# Patient Record
Sex: Male | Born: 1990 | Race: White | Hispanic: No | Marital: Single | Smoking: Current every day smoker
Health system: Southern US, Community
[De-identification: ages and names within clinical notes are randomized; demographics above are authoritative.]

## PROBLEM LIST (undated history)

## (undated) DIAGNOSIS — F99 Mental disorder, not otherwise specified: Secondary | ICD-10-CM

## (undated) DIAGNOSIS — F329 Major depressive disorder, single episode, unspecified: Secondary | ICD-10-CM

## (undated) DIAGNOSIS — F32A Depression, unspecified: Secondary | ICD-10-CM

---

## 2013-05-20 ENCOUNTER — Encounter (HOSPITAL_COMMUNITY): Payer: Self-pay | Admitting: *Deleted

## 2013-05-20 ENCOUNTER — Inpatient Hospital Stay (HOSPITAL_COMMUNITY)
Admission: RE | Admit: 2013-05-20 | Discharge: 2013-05-25 | DRG: 897 | Disposition: A | Discharge status: Home / Self Care | Payer: 59 | Attending: Psychiatry | Admitting: Psychiatry

## 2013-05-20 ENCOUNTER — Emergency Department (HOSPITAL_COMMUNITY)
Admission: EM | Admit: 2013-05-20 | Discharge: 2013-05-20 | Disposition: A | Discharge status: Other Acute Inpatient Hospital | Payer: Self-pay | Attending: Emergency Medicine | Admitting: Emergency Medicine

## 2013-05-20 ENCOUNTER — Encounter (HOSPITAL_COMMUNITY): Payer: Self-pay | Admitting: Emergency Medicine

## 2013-05-20 DIAGNOSIS — F1020 Alcohol dependence, uncomplicated: Principal | ICD-10-CM

## 2013-05-20 DIAGNOSIS — Z0289 Encounter for other administrative examinations: Secondary | ICD-10-CM | POA: Insufficient documentation

## 2013-05-20 DIAGNOSIS — F1220 Cannabis dependence, uncomplicated: Secondary | ICD-10-CM

## 2013-05-20 DIAGNOSIS — F17200 Nicotine dependence, unspecified, uncomplicated: Secondary | ICD-10-CM | POA: Insufficient documentation

## 2013-05-20 DIAGNOSIS — F489 Nonpsychotic mental disorder, unspecified: Secondary | ICD-10-CM | POA: Insufficient documentation

## 2013-05-20 DIAGNOSIS — F3289 Other specified depressive episodes: Secondary | ICD-10-CM | POA: Insufficient documentation

## 2013-05-20 DIAGNOSIS — F1410 Cocaine abuse, uncomplicated: Secondary | ICD-10-CM | POA: Diagnosis present

## 2013-05-20 DIAGNOSIS — Z5900 Homelessness unspecified: Secondary | ICD-10-CM

## 2013-05-20 DIAGNOSIS — R059 Cough, unspecified: Secondary | ICD-10-CM | POA: Insufficient documentation

## 2013-05-20 DIAGNOSIS — J3489 Other specified disorders of nose and nasal sinuses: Secondary | ICD-10-CM | POA: Insufficient documentation

## 2013-05-20 DIAGNOSIS — F321 Major depressive disorder, single episode, moderate: Secondary | ICD-10-CM | POA: Diagnosis present

## 2013-05-20 DIAGNOSIS — F1920 Other psychoactive substance dependence, uncomplicated: Secondary | ICD-10-CM

## 2013-05-20 DIAGNOSIS — R0981 Nasal congestion: Secondary | ICD-10-CM

## 2013-05-20 DIAGNOSIS — L539 Erythematous condition, unspecified: Secondary | ICD-10-CM | POA: Insufficient documentation

## 2013-05-20 DIAGNOSIS — F329 Major depressive disorder, single episode, unspecified: Secondary | ICD-10-CM | POA: Insufficient documentation

## 2013-05-20 DIAGNOSIS — F1010 Alcohol abuse, uncomplicated: Secondary | ICD-10-CM | POA: Diagnosis present

## 2013-05-20 DIAGNOSIS — R05 Cough: Secondary | ICD-10-CM | POA: Insufficient documentation

## 2013-05-20 HISTORY — DX: Major depressive disorder, single episode, unspecified: F32.9

## 2013-05-20 HISTORY — DX: Depression, unspecified: F32.A

## 2013-05-20 HISTORY — DX: Mental disorder, not otherwise specified: F99

## 2013-05-20 LAB — COMPREHENSIVE METABOLIC PANEL
ALT: 21 U/L (ref 0–53)
Alkaline Phosphatase: 52 U/L (ref 39–117)
BUN: 7 mg/dL (ref 6–23)
CO2: 27 mEq/L (ref 19–32)
GFR calc Af Amer: >90 mL/min (ref >90)
GFR calc non Af Amer: >90 mL/min (ref >90)
Glucose, Bld: 84 mg/dL (ref 70–99)
Potassium: 3.6 mEq/L (ref 3.5–5.1)
Sodium: 135 mEq/L (ref 135–145)
Total Bilirubin: 0.2 mg/dL — ABNORMAL LOW (ref 0.3–1.2)
Total Protein: 7.9 g/dL (ref 6.0–8.3)

## 2013-05-20 LAB — CBC
HCT: 44.8 % (ref 39.0–52.0)
Hemoglobin: 15.6 g/dL (ref 13.0–17.0)
MCH: 32.6 pg (ref 26.0–34.0)
RBC: 4.78 MIL/uL (ref 4.22–5.81)

## 2013-05-20 LAB — RAPID URINE DRUG SCREEN, HOSP PERFORMED: Barbiturates: NOT DETECTED

## 2013-05-20 MED ORDER — CHLORDIAZEPOXIDE HCL 25 MG PO CAPS
25.0000 mg | ORAL_CAPSULE | Freq: Four times a day (QID) | ORAL | Status: AC | PRN
  Filled 2013-05-20: qty 1

## 2013-05-20 MED ORDER — CETIRIZINE-PSEUDOEPHEDRINE ER 5-120 MG PO TB12: 1.0000 | ORAL_TABLET | Freq: Two times a day (BID) | ORAL | Status: DC

## 2013-05-20 MED ORDER — CHLORDIAZEPOXIDE HCL 25 MG PO CAPS
25.0000 mg | ORAL_CAPSULE | Freq: Three times a day (TID) | ORAL | Status: AC
  Administered 2013-05-22 – 2013-05-23 (×3): 25 mg via ORAL
  Filled 2013-05-20 (×3): qty 1

## 2013-05-20 MED ORDER — CHLORDIAZEPOXIDE HCL 25 MG PO CAPS
25.0000 mg | ORAL_CAPSULE | ORAL | Status: AC
  Administered 2013-05-23 – 2013-05-24 (×2): 25 mg via ORAL
  Filled 2013-05-20 (×2): qty 1

## 2013-05-20 MED ORDER — HYDROXYZINE HCL 25 MG PO TABS: 25.0000 mg | ORAL_TABLET | Freq: Four times a day (QID) | ORAL | Status: AC | PRN

## 2013-05-20 MED ORDER — MAGNESIUM HYDROXIDE 400 MG/5ML PO SUSP: 30.0000 mL | Freq: Every day | ORAL | Status: DC | PRN

## 2013-05-20 MED ORDER — THIAMINE HCL 100 MG/ML IJ SOLN: 100.0000 mg | Freq: Once | INTRAMUSCULAR | Status: DC

## 2013-05-20 MED ORDER — ACETAMINOPHEN 325 MG PO TABS: 650.0000 mg | ORAL_TABLET | Freq: Four times a day (QID) | ORAL | Status: DC | PRN

## 2013-05-20 MED ORDER — ONDANSETRON 4 MG PO TBDP: 4.0000 mg | ORAL_TABLET | Freq: Four times a day (QID) | ORAL | Status: AC | PRN

## 2013-05-20 MED ORDER — TRAZODONE HCL 50 MG PO TABS
50.0000 mg | ORAL_TABLET | Freq: Every evening | ORAL | Status: DC | PRN
  Administered 2013-05-22: 50 mg via ORAL
  Filled 2013-05-20: qty 1

## 2013-05-20 MED ORDER — VITAMIN B-1 100 MG PO TABS
100.0000 mg | ORAL_TABLET | Freq: Every day | ORAL | Status: DC
  Administered 2013-05-21 – 2013-05-25 (×5): 100 mg via ORAL
  Filled 2013-05-20 (×7): qty 1

## 2013-05-20 MED ORDER — ADULT MULTIVITAMIN W/MINERALS CH
1.0000 | ORAL_TABLET | Freq: Every day | ORAL | Status: DC
  Administered 2013-05-21 – 2013-05-25 (×5): 1 via ORAL
  Filled 2013-05-20 (×7): qty 1

## 2013-05-20 MED ORDER — LOPERAMIDE HCL 2 MG PO CAPS: 2.0000 mg | ORAL_CAPSULE | ORAL | Status: AC | PRN

## 2013-05-20 MED ORDER — CHLORDIAZEPOXIDE HCL 25 MG PO CAPS
25.0000 mg | ORAL_CAPSULE | Freq: Every day | ORAL | Status: AC
  Administered 2013-05-25: 25 mg via ORAL
  Filled 2013-05-20 (×2): qty 1

## 2013-05-20 MED ORDER — GUAIFENESIN ER 600 MG PO TB12: 1200.0000 mg | ORAL_TABLET | Freq: Two times a day (BID) | ORAL | Status: DC

## 2013-05-20 MED ORDER — CHLORDIAZEPOXIDE HCL 25 MG PO CAPS
25.0000 mg | ORAL_CAPSULE | Freq: Four times a day (QID) | ORAL | Status: AC
  Administered 2013-05-20 – 2013-05-22 (×6): 25 mg via ORAL
  Filled 2013-05-20 (×5): qty 1

## 2013-05-20 MED ORDER — ALUM & MAG HYDROXIDE-SIMETH 200-200-20 MG/5ML PO SUSP: 30.0000 mL | ORAL | Status: DC | PRN

## 2013-05-20 NOTE — ED Provider Notes (Signed)
CSN: [id number]     Arrival date & time 05/20/13  1848 History   First MD Initiated Contact with Patient 05/20/13 2020     Chief Complaint  Patient presents with  . Medical Clearance   HPI  History provided by the patient. Patient is a 22 year old male presenting with request for medical clearance. The patient was seen at [facility] for cocaine dependency. He has been accepted for treatment and was sent for medical clearance. Patient does mention having some recent nasal congestion. He does snort cocaine reports using heavy amounts in the last several days. He has occasional brief nosebleeds. He denies any headache, fever, chills or sweats. Patient has no further complaints. He has not used any treatments for his symptoms. Denies any aggravating or alleviating factors. Denies any associated depression, SI or HI.    No past medical history on file. No past surgical history on file. No family history on file. History  Substance Use Topics  . Smoking status: Current Every Day Smoker -- 2.00 packs/day    Types: Cigarettes  . Smokeless tobacco: Not on file  . Alcohol Use: 0.0 oz/week    18-24 Cans of beer per week    Review of Systems  Constitutional: Negative for fever, chills and diaphoresis.  HENT: Positive for congestion, rhinorrhea and sinus pressure.   Respiratory: Positive for cough.   Gastrointestinal: Negative for nausea, vomiting and constipation.  Psychiatric/Behavioral: Negative for suicidal ideas.  All other systems reviewed and are negative.    Allergies  Review of patient's allergies indicates no known allergies.  Home Medications  No current outpatient prescriptions on file. BP 133/74  Pulse 89  Temp(Src) 98.6 F (37 C) (Oral)  Resp 16  SpO2 100% Physical Exam  Nursing note and vitals reviewed. Constitutional: He is oriented to person, place, and time. He appears well-developed and well-nourished. No distress.  HENT:  Head: Normocephalic and atraumatic.  Right  Ear: Tympanic membrane normal.  Left Ear: Tympanic membrane normal.  Mild erythema in a rotation to the nasal septum bilaterally. No perforation. No bleeding. There is some thick mucus to the superior aspect of the nostrils greatest on the right. No significant tenderness over the maxillary sinuses.  Cardiovascular: Normal rate and regular rhythm.   Pulmonary/Chest: Effort normal and breath sounds normal. No respiratory distress.  Abdominal: Soft.  Neurological: He is alert and oriented to person, place, and time.  Skin: Skin is warm.  Psychiatric: He has a normal mood and affect. His behavior is normal.    ED Course  Procedures     COORDINATION OF CARE:  Nursing notes reviewed. Vital signs reviewed. Initial pt interview and examination performed.   8:10PM -the patient seen and evaluated. Patient well appearing. Some complaints of nasal congestion. Otherwise examined history and concerning. Patient clinically medically cleared for continued cocaine dependence treatment. Discussed work up plan with pt at bedside, which includes laboratory tests for further medical clearance. Pt agrees with plan.  Medical labs unremarkable. Patient medically cleared for continued treatment.  Results for orders placed during the hospital encounter of 05/20/13  CBC      Result Value Range   WBC 6.0  4.0 - 10.5 K/uL   RBC 4.78  4.22 - 5.81 MIL/uL   Hemoglobin 15.6  13.0 - 17.0 g/dL   HCT 40.9  81.1 - 91.4 %   MCV 93.7  78.0 - 100.0 fL   MCH 32.6  26.0 - 34.0 pg   MCHC 34.8  30.0 -  36.0 g/dL   RDW 16.1  09.6 - 04.5 %   Platelets 150  150 - 400 K/uL  COMPREHENSIVE METABOLIC PANEL      Result Value Range   Sodium 135  135 - 145 mEq/L   Potassium 3.6  3.5 - 5.1 mEq/L   Chloride 97  96 - 112 mEq/L   CO2 27  19 - 32 mEq/L   Glucose, Bld 84  70 - 99 mg/dL   BUN 7  6 - 23 mg/dL   Creatinine, Ser 4.09  0.50 - 1.35 mg/dL   Calcium 9.3  8.4 - 81.1 mg/dL   Total Protein 7.9  6.0 - 8.3 g/dL   Albumin  4.4  3.5 - 5.2 g/dL   AST 21  0 - 37 U/L   ALT 21  0 - 53 U/L   Alkaline Phosphatase 52  39 - 117 U/L   Total Bilirubin 0.2 (*) 0.3 - 1.2 mg/dL   GFR calc non Af Amer >90  >90 mL/min   GFR calc Af Amer >90  >90 mL/min  ETHANOL      Result Value Range   Alcohol, Ethyl (B) <11  0 - 11 mg/dL  URINE RAPID DRUG SCREEN (HOSP PERFORMED)      Result Value Range   Opiates NONE DETECTED  NONE DETECTED   Cocaine POSITIVE (*) NONE DETECTED   Benzodiazepines NONE DETECTED  NONE DETECTED   Amphetamines NONE DETECTED  NONE DETECTED   Tetrahydrocannabinol POSITIVE (*) NONE DETECTED   Barbiturates NONE DETECTED  NONE DETECTED      MDM   1. Cocaine abuse   2. Sinus congestion        Angus Seller, PA-C 05/20/13 2049

## 2013-05-20 NOTE — ED Notes (Addendum)
Pt states that he has a bed at [facility] and is coming here for med clearance.  Requesting detox from cocaine, etoh and marijuana.  States he has used all of them in the past 12 hrs.  States he drinks an 18 pack of beer a day.  Denies SI/HI.
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
## 2013-05-20 NOTE — Tx Team (Signed)
Initial Interdisciplinary Treatment Plan  PATIENT STRENGTHS: (choose at least two) Ability for insight Average or above average intelligence Capable of independent living General fund of knowledge  PATIENT STRESSORS: Substance abuse   PROBLEM LIST: Problem List/Patient Goals Date to be addressed Date deferred Reason deferred Estimated date of resolution  Depression 05/20/13     Polysubstance abuse 05/20/13                                                DISCHARGE CRITERIA:  Ability to meet basic life and health needs Improved stabilization in mood, thinking, and/or behavior Withdrawal symptoms are absent or subacute and managed without 24-hour nursing intervention  PRELIMINARY DISCHARGE PLAN: Attend aftercare/continuing care group Placement in alternative living arrangements  PATIENT/FAMIILY INVOLVEMENT: This treatment plan has been presented to and reviewed with the patient, Saron Tweed, and/or family member, .  The patient and family have been given the opportunity to ask questions and make suggestions.  Antoinett Dorman, [place] 05/20/2013, 10:55 PM
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
## 2013-05-20 NOTE — ED Notes (Signed)
Report called to [facility]

## 2013-05-20 NOTE — ED Notes (Signed)
[facility] called for transport. 

## 2013-05-21 DIAGNOSIS — F1220 Cannabis dependence, uncomplicated: Secondary | ICD-10-CM

## 2013-05-21 DIAGNOSIS — F1020 Alcohol dependence, uncomplicated: Secondary | ICD-10-CM

## 2013-05-21 MED ORDER — MIRTAZAPINE 15 MG PO TABS
15.0000 mg | ORAL_TABLET | Freq: Every day | ORAL | Status: DC
  Administered 2013-05-21 – 2013-05-24 (×4): 15 mg via ORAL
  Filled 2013-05-21 (×3): qty 1
  Filled 2013-05-21: qty 14
  Filled 2013-05-21 (×3): qty 1

## 2013-05-21 MED ORDER — NICOTINE POLACRILEX 2 MG MT GUM
2.0000 mg | CHEWING_GUM | OROMUCOSAL | Status: DC | PRN
  Administered 2013-05-21 – 2013-05-25 (×11): 2 mg via ORAL
  Filled 2013-05-21 (×7): qty 1

## 2013-05-21 MED ORDER — ENSURE COMPLETE PO LIQD
237.0000 mL | Freq: Two times a day (BID) | ORAL | Status: DC
  Administered 2013-05-21 – 2013-05-25 (×8): 237 mL via ORAL

## 2013-05-21 NOTE — Progress Notes (Signed)
D Pt. Denies SI and HI.  No complaints of pain or discomfort noted.  A Writer offers support and encouragement.    R Pt. Remains safe on the unit.  Pt is interacting with his peers in the dayroom, and has been calm and cooperative this pm

## 2013-05-21 NOTE — Progress Notes (Signed)
[facility] Group Notes:  (Nursing/MHT/Case Management/Adjunct)  Date:  05/21/2013  Time:  3:40 PM  Type of Therapy:  Psychoeducational Skills  Participation Level:  Did Not Attend   Summary of Progress/Problems: pt was in bed asleep.  Caswell Corwin 05/21/2013, 3:40 PM

## 2013-05-21 NOTE — Progress Notes (Signed)
NUTRITION ASSESSMENT  Pt identified as at risk on the Malnutrition Screen Tool  INTERVENTION: 1. Educated patient on the importance of nutrition and encouraged intake of food and beverages. 2. Discussed weight goals. 3. Supplements: MVI and thiamine.  Will order Ensure bid  NUTRITION DIAGNOSIS: Unintentional weight loss related to sub-optimal intake as evidenced by pt report.   Goal: Pt to meet >/= 90% of their estimated nutrition needs.  Monitor:  PO intake  Assessment:  Patient admitted with cocaine abuse and crystal meth.  Patient reports only fair intake currently and prior to admit.  UBW of 160-175 lbs but is unable to state when he last weighed this.  Patient was surprised at current weight.  Patient is underweight.  Patient is 82% of his UBW.    22 y.o. male  Height: Ht Readings from Last 1 Encounters:  05/20/13 6\' 2"  (1.88 m)    Weight: Wt Readings from Last 1 Encounters:  05/20/13 131 lb (59.421 kg)    Weight Hx: Wt Readings from Last 10 Encounters:  05/20/13 131 lb (59.421 kg)    BMI:  Body mass index is 16.81 kg/(m^2). Pt meets criteria for underweight based on current BMI.  Estimated Nutritional Needs: Kcal: 25-30 kcal/kg Protein: > 1 gram protein/kg Fluid: 1 ml/kcal  Diet Order: General Pt is also offered choice of unit snacks mid-morning and mid-afternoon.  Pt is eating as desired.   Lab results and medications reviewed.   Oran Rein, RD, LDN Clinical Inpatient Dietitian Pager:  [phone] Weekend and after hours pager:  [phone]

## 2013-05-21 NOTE — [facility] Group Notes (Signed)
[facility] LCSW Group Therapy Note   05/21/2013/10:00 AM  Type of Therapy and Topic: Group Therapy: Feelings Around Returning Home & Establishing a Supportive Framework and Activity to Identify signs of Improvement or Decompensation   Participation Level: Did not attend; declined when encouraged to join group   Carney Bern, LCSW

## 2013-05-21 NOTE — Progress Notes (Signed)
05-21-13  NSG NOTE  7a-7p  D: Affect is depressed and anxious.  Mood is depressed, irritable and anxious.  Behavior is cooperative with encouragement, direction and support.  Interacts appropriately with peers and staff.  Participated in goals group, counselor lead group, and recreation.  Goal for today is to identify what changes he needs to make in his life.   A:  Medications per MD order.  Support given throughout day.  1:1 time spent with pt.  R:  Following treatment plan.  Denies HI/SI, auditory or visual hallucinations.  Contracts for safety.

## 2013-05-21 NOTE — [facility] Group Notes (Signed)
[facility] Group Notes:  (Nursing/MHT/Case Management/Adjunct)  Date:  05/21/2013  Time:  3:07 PM  Type of Therapy:  Psychoeducational Skills  Participation Level:  Minimal  Participation Quality:  Attentive  Affect:  Anxious and Depressed  Cognitive:  Oriented  Insight:  Limited  Engagement in Group:  Limited  Modes of Intervention:  Education and Support  Summary of Progress/Problems:   Pt watched educational video on how attitude effects behavior.  Video was discussed as group and what was learned.   Inda Merlin 05/21/2013, 3:07 PM

## 2013-05-21 NOTE — H&P (Signed)
Psychiatric Admission Assessment Adult  Patient Identification:  Kristopher Hess  Date of Evaluation:  05/21/2013  Chief Complaint:  alcohol detox  History of Present Illness: This is a 22 year old Caucasian male his first psychiatric admission to this hospital. Admitted as walk-in, brought in by boss with complaints of alcohol/drug intoxications requesting treatment. Patient reports, "My boss brought me to this hospital yesterday. I had told him that I needed help for my substance abuse problems. I also want to make sure that I will have a job after my rehabilitation treatment. 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 I'm pretty much homeless since August of this year. I live with my friends. It is my own fault that I'm homeless. I make 1500 dollars a month, I spend it all drugs and parties.I have been using drugs x 5 years, and THC x 10. Drugs make me feel complete, I  thought. I'm an addict. I'm not happy when sober. My goal is to get clean. Get into a meaningful relationship with someone and stay sober. I'm very depressed, have been for a long time. I don't sleep at night and I don't have any appetite".  Elements:  Location:  [facility] adult unit. Quality:  Black-out, increased drug, alcohol use. Severity:  Severe. Timing:  Constant. Duration:  Chronic. Context:  Homelessness.  Associated Signs/Synptoms:  Depression Symptoms:  depressed mood, feelings of worthlessness/guilt, hopelessness, anxiety, insomnia, loss of energy/fatigue,  (Hypo) Manic Symptoms:  Impulsivity,  Anxiety Symptoms:  Excessive Worry,  Psychotic Symptoms:  Hallucinations: None  PTSD Symptoms: Had a traumatic exposure:  Denies  Psychiatric Specialty Exam: Physical Exam  Constitutional: He is oriented to person, place, and time. He appears well-developed.  HENT:  Head: Normocephalic.  Eyes: Pupils are equal, round, and reactive to light.  Neck: Normal range of motion.  Cardiovascular:  Normal rate.   Respiratory: Effort normal.  GI: Soft.  Genitourinary:  Did not assess.  Musculoskeletal: Normal range of motion.  Neurological: He is alert and oriented to person, place, and time.  Skin: Skin is warm and dry.  Psychiatric: His speech is normal and behavior is normal. Thought content normal. His mood appears anxious (Rated #8). Cognition and memory are normal. He expresses impulsivity. He exhibits a depressed mood (Rated #8).    Review of Systems  Constitutional: Positive for chills, malaise/fatigue and diaphoresis.  HENT: Negative.   Eyes: Negative.   Respiratory: Negative.   Cardiovascular: Negative.   Gastrointestinal: Positive for nausea.  Genitourinary: Negative.   Musculoskeletal: Positive for myalgias.  Skin: Negative.   Neurological: Positive for tremors.  Endo/Heme/Allergies: Negative.   Psychiatric/Behavioral: Positive for depression (Rated #8) and substance abuse (Alcohol dependence). Negative for suicidal ideas, hallucinations and memory loss. The patient is nervous/anxious (Rated #8) and has insomnia.     Blood pressure 122/72, pulse 74, temperature 97.9 F (36.6 C), temperature source Oral, resp. rate 16, height 6\' 2"  (1.88 m), weight 59.421 kg (131 lb).Body mass index is 16.81 kg/(m^2).  General Appearance: Fairly Groomed  Patent attorney::  Fair  Speech:  Clear and Coherent  Volume:  Normal  Mood:  Anxious and Depressed  Affect:  Flat  Thought Process:  Coherent and Goal Directed  Orientation:  Full (Time, Place, and Person)  Thought Content:  Rumination  Suicidal Thoughts:  No  Homicidal Thoughts:  No  Memory:  Immediate;   Good Recent;   Good Remote;   Good  Judgement:  Impaired  Insight:  Fair  Psychomotor Activity:  Normal  Concentration:  Fair  Recall:  Good  Akathisia:  No  Handed:  Right  AIMS (if indicated):     Assets:  Communication Skills Desire for Improvement  Sleep:  Number of Hours: 4.5    Past Psychiatric  History: Diagnosis: Alcohol dependence, Cannabis dependence  Hospitalizations: [facility]  Outpatient Care: None  Substance Abuse Care: None, but would like referral  Self-Mutilation: Denies  Suicidal Attempts: Denies  Violent Behaviors: Denies   Past Medical History:   Past Medical History  Diagnosis Date  . Mental disorder   . Depression    None.  Allergies:  No Known Allergies  PTA Medications: Prescriptions prior to admission  Medication Sig Dispense Refill  . cetirizine-pseudoephedrine (ZYRTEC-D) 5-120 MG per tablet Take 1 tablet by mouth 2 (two) times daily.  30 tablet  0  . guaiFENesin (MUCINEX) 600 MG 12 hr tablet Take 2 tablets (1,200 mg total) by mouth 2 (two) times daily.  40 tablet  0    Previous Psychotropic Medications:  Medication/Dose  See medication lists               Substance Abuse History in the last 12 months:  yes  Consequences of Substance Abuse: Medical Consequences:  Liver damage, Possible death by overdose Legal Consequences:  Arrests, jail time, Loss of driving privilege. Family Consequences:  Family discord, divorce and or separation.  Social History:  reports that he has been smoking Cigarettes.  He has been smoking about 2.00 packs per day. He does not have any smokeless tobacco history on file. He reports that he drinks alcohol. He reports that he uses illicit drugs (Cocaine and Marijuana). Additional Social History: Pain Medications: none Prescriptions: none Over the Counter: none History of alcohol / drug use?: Yes Longest period of sobriety (when/how long): none Negative Consequences of Use: Financial;Legal;Personal relationships;Work / Programmer, multimedia Withdrawal Symptoms: Tremors;Other (Comment) (anxiety) Name of Substance 1: ETOH 1 - Age of First Use: 10 1 - Amount (size/oz): 18-24 beers 1 - Frequency: daily 1 - Duration: ongoing x 3 years 1 - Last Use / Amount: 05/20/13 - 3 beers this AM Name of Substance 2: Cocaine 2 - Age of First  Use: 16 2 - Amount (size/oz): 1 gram 2 - Frequency: daily 2 - Duration: ongoing for one year 2 - Last Use / Amount: 05/19/13 - 1 gram Name of Substance 3: Marijuana 3 - Age of First Use: [age] 3 - Amount (size/oz): 1 blunt 3 - Frequency: daily 3 - Duration: ongoing for years 3 - Last Use / Amount: 05/19/13 - 1 blunt  Social history Current Place of Residence: [place], [place]  Place of Birth:  [place], [place]  Family Members: None reported  Marital Status:  Single  Children: 0  Sons:  Daughters:  Relationships: Single  Education:  GED  Educational Problems/Performance: Obtained GED  Religious Beliefs/Practices: NA  History of Abuse (Emotional/Phsycial/Sexual): None reported  Occupational Experiences: Employed  Hotel manager History:  None.  Legal History: None pending  Hobbies/Interests: None reported  Family History:  History reviewed. No pertinent family history.  Results for orders placed during the hospital encounter of 05/20/13 (from the past 72 hour(s))  URINE RAPID DRUG SCREEN (HOSP PERFORMED)     Status: Abnormal   Collection Time    05/20/13  6:48 PM      Result Value Range   Opiates NONE DETECTED  NONE DETECTED   Cocaine POSITIVE (*) NONE DETECTED   Benzodiazepines NONE DETECTED  NONE DETECTED   Amphetamines NONE DETECTED  NONE DETECTED   Tetrahydrocannabinol POSITIVE (*) NONE DETECTED   Barbiturates NONE DETECTED  NONE DETECTED   Comment:            DRUG SCREEN FOR MEDICAL PURPOSES     ONLY.  IF CONFIRMATION IS NEEDED     FOR ANY PURPOSE, NOTIFY LAB     WITHIN 5 DAYS.                LOWEST DETECTABLE LIMITS     FOR URINE DRUG SCREEN     Drug Class       Cutoff (ng/mL)     Amphetamine      1000     Barbiturate      200     Benzodiazepine   200     Tricyclics       300     Opiates          300     Cocaine          300     THC              50  CBC     Status: None   Collection Time    05/20/13  7:10 PM      Result Value Range   WBC 6.0  4.0 -  10.5 K/uL   RBC 4.78  4.22 - 5.81 MIL/uL   Hemoglobin 15.6  13.0 - 17.0 g/dL   HCT 16.1  09.6 - 04.5 %   MCV 93.7  78.0 - 100.0 fL   MCH 32.6  26.0 - 34.0 pg   MCHC 34.8  30.0 - 36.0 g/dL   RDW 40.9  81.1 - 91.4 %   Platelets 150  150 - 400 K/uL  COMPREHENSIVE METABOLIC PANEL     Status: Abnormal   Collection Time    05/20/13  7:10 PM      Result Value Range   Sodium 135  135 - 145 mEq/L   Potassium 3.6  3.5 - 5.1 mEq/L   Chloride 97  96 - 112 mEq/L   CO2 27  19 - 32 mEq/L   Glucose, Bld 84  70 - 99 mg/dL   BUN 7  6 - 23 mg/dL   Creatinine, Ser 7.82  0.50 - 1.35 mg/dL   Calcium 9.3  8.4 - 95.6 mg/dL   Total Protein 7.9  6.0 - 8.3 g/dL   Albumin 4.4  3.5 - 5.2 g/dL   AST 21  0 - 37 U/L   ALT 21  0 - 53 U/L   Alkaline Phosphatase 52  39 - 117 U/L   Total Bilirubin 0.2 (*) 0.3 - 1.2 mg/dL   GFR calc non Af Amer >90  >90 mL/min   GFR calc Af Amer >90  >90 mL/min   Comment: (NOTE)     The eGFR has been calculated using the CKD EPI equation.     This calculation has not been validated in all clinical situations.     eGFR's persistently <90 mL/min signify possible Chronic Kidney     Disease.  ETHANOL     Status: None   Collection Time    05/20/13  7:10 PM      Result Value Range   Alcohol, Ethyl (B) <11  0 - 11 mg/dL   Comment:            LOWEST DETECTABLE LIMIT FOR  SERUM ALCOHOL IS 11 mg/dL     FOR MEDICAL PURPOSES ONLY   Psychological Evaluations:  Assessment:   DSM5: Schizophrenia Disorders:  NA Obsessive-Compulsive Disorders:  NA Trauma-Stressor Disorders:  NA Substance/Addictive Disorders:  Alcohol Related Disorder - Severe (303.90) and Cannabis Use Disorder - Severe (304.30) Depressive Disorders:  Substance induced mood disorder.  AXIS I:  Alcohol Related Disorder - Severe (303.90) and Cannabis Use Disorder - Severe (304.30), Substance induced mood disorder AXIS II:  Deferred AXIS III:   Past Medical History  Diagnosis Date  . Mental disorder   .  Depression    AXIS IV:  other psychosocial or environmental problems and Polysubstance dependence AXIS V:  1-10 persistent dangerousness to self and others present  Treatment Plan/Recommendations: 1. Admit for crisis management and stabilization, estimated length of stay 3-5 days.  2. Medication management to reduce current symptoms to base line and improve the patient's overall level of functioning; (a). Initiate Remeron 15 mg Q bedtime for sleep, depression and appetite stimulation. 3. Treat health problems as indicated.  4. Develop treatment plan to decrease risk of relapse upon discharge and the need for readmission.  5. Psycho-social education regarding relapse prevention and self care.  6. Health care follow up as needed for medical problems.  7. Review, reconcile, and reinstate any pertinent home medications for other health issues where appropriate. 8. Call for consults with hospitalist for any additional specialty patient care services as needed.  Treatment Plan Summary: Daily contact with patient to assess and evaluate symptoms and progress in treatment Medication management  Current Medications:  Current Facility-Administered Medications  Medication Dose Route Frequency Provider Last Rate Last Dose  . acetaminophen (TYLENOL) tablet 650 mg  650 mg Oral Q6H PRN Nanine Means, NP      . alum & mag hydroxide-simeth (MAALOX/MYLANTA) 200-200-20 MG/5ML suspension 30 mL  30 mL Oral Q4H PRN Nanine Means, NP      . chlordiazePOXIDE (LIBRIUM) capsule 25 mg  25 mg Oral Q6H PRN Nanine Means, NP      . chlordiazePOXIDE (LIBRIUM) capsule 25 mg  25 mg Oral QID Nanine Means, NP   25 mg at 05/21/13 0752   Followed by  . [START ON 05/22/2013] chlordiazePOXIDE (LIBRIUM) capsule 25 mg  25 mg Oral TID Nanine Means, NP       Followed by  . [START ON 05/23/2013] chlordiazePOXIDE (LIBRIUM) capsule 25 mg  25 mg Oral BH-qamhs Nanine Means, NP       Followed by  . [START ON 05/25/2013] chlordiazePOXIDE  (LIBRIUM) capsule 25 mg  25 mg Oral Daily Nanine Means, NP      . hydrOXYzine (ATARAX/VISTARIL) tablet 25 mg  25 mg Oral Q6H PRN Nanine Means, NP      . loperamide (IMODIUM) capsule 2-4 mg  2-4 mg Oral PRN Nanine Means, NP      . magnesium hydroxide (MILK OF MAGNESIA) suspension 30 mL  30 mL Oral Daily PRN Nanine Means, NP      . multivitamin with minerals tablet 1 tablet  1 tablet Oral Daily Nanine Means, NP   1 tablet at 05/21/13 0751  . ondansetron (ZOFRAN-ODT) disintegrating tablet 4 mg  4 mg Oral Q6H PRN Nanine Means, NP      . thiamine (B-1) injection 100 mg  100 mg Intramuscular Once Nanine Means, NP      . thiamine (VITAMIN B-1) tablet 100 mg  100 mg Oral Daily Nanine Means, NP   100 mg at 05/21/13 0751  .  traZODone (DESYREL) tablet 50 mg  50 mg Oral QHS PRN Nanine Means, NP        Observation Level/Precautions:  15 minute checks  Laboratory:  Rviewed ED lab findings on file  Psychotherapy: Group sessions, AA/NA meetings  Medications:  See medication lists  Consultations: As needed   Discharge Concerns: Maintaining sobriety   Estimated LOS: 2-4 days  Other:     I certify that inpatient services furnished can reasonably be expected to improve the patient's condition.   Armandina Stammer I, PMHNP-B[facility] 12/28/201410:31 AM I have personally seen the patient and agreed with the findings and involved in the treatment plan. Kathryne Sharper, MD

## 2013-05-21 NOTE — [facility] Suicide Risk Assessment (Signed)
Suicide Risk Assessment  Admission Assessment     Nursing information obtained from:    Demographic factors:    Current Mental Status:    Loss Factors:    Historical Factors:    Risk Reduction Factors:     CLINICAL FACTORS:   Alcohol/Substance Abuse/Dependencies  COGNITIVE FEATURES THAT CONTRIBUTE TO RISK:  Closed-mindedness Loss of executive function Polarized thinking Thought constriction (tunnel vision)    SUICIDE RISK:   Mild:  Suicidal ideation of limited frequency, intensity, duration, and specificity.  There are no identifiable plans, no associated intent, mild dysphoria and related symptoms, good self-control (both objective and subjective assessment), few other risk factors, and identifiable protective factors, including available and accessible social support.  PLAN OF CARE: Please see complete admission note for more details.  I certify that inpatient services furnished can reasonably be expected to improve the patient's condition.  Meko Masterson T. 05/21/2013, 11:16 AM

## 2013-05-21 NOTE — Progress Notes (Signed)
Patient did attend the evening speaker AA meeting.  

## 2013-05-21 NOTE — [facility] Group Notes (Signed)
[facility] Group Notes:  (Nursing/MHT/Case Management/Adjunct)  Date:  05/21/2013  Time:  11:25 AM  Type of Therapy:  Psychoeducational Skills  Participation Level:  Active  Participation Quality:  Attentive, Redirectable and Sharing  Affect:  Anxious, Depressed and Irritable  Cognitive:  Oriented  Insight:  Lacking  Engagement in Group:  Engaged, Off Topic and Supportive  Modes of Intervention:  Activity, Clarification, Discussion, Education, Exploration, Orientation, Problem-solving, Rapport Building, Socialization and Support  Summary of Progress/Problems:  Pt stated that he did not feel well and expressed some of the symptoms he was experiencing with the group.  Focus is on identifying poor choices he has made in the past and what habits he will have to change to be successful post discharge.  Inda Merlin 05/21/2013, 11:25 AM

## 2013-05-21 NOTE — Progress Notes (Signed)
Patient ID: Kristopher Hess, male   DOB: 1990/11/27, 22 y.o.   MRN: [id number] D)  Pt was admitted to unit, briefly oriented to unit, rules, taken to his room where he had gone to bed and was dozing off.  Was awakened to explain the librium protocol and was given 25 mg to start .  Was appreciative, denied n/v/d, currently resting, eyes closed, resp reg, unlabored, no other c/o's. A)  Will continue to monitor for safety, continue POC R)  Safety maintained at this time.
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
## 2013-05-21 NOTE — ED Provider Notes (Signed)
Medical screening examination/treatment/procedure(s) were performed by non-physician practitioner and as supervising physician I was immediately available for consultation/collaboration.  EKG Interpretation   None         Titania Gault S Jaire Pinkham, MD 05/21/13 0043 

## 2013-05-22 DIAGNOSIS — F1020 Alcohol dependence, uncomplicated: Principal | ICD-10-CM

## 2013-05-22 DIAGNOSIS — F1994 Other psychoactive substance use, unspecified with psychoactive substance-induced mood disorder: Secondary | ICD-10-CM

## 2013-05-22 DIAGNOSIS — F321 Major depressive disorder, single episode, moderate: Secondary | ICD-10-CM

## 2013-05-22 MED ORDER — TRAZODONE HCL 100 MG PO TABS
100.0000 mg | ORAL_TABLET | Freq: Every evening | ORAL | Status: DC | PRN
  Administered 2013-05-22 – 2013-05-24 (×3): 100 mg via ORAL
  Filled 2013-05-22: qty 1
  Filled 2013-05-22: qty 14
  Filled 2013-05-22 (×3): qty 1

## 2013-05-22 NOTE — Progress Notes (Signed)
D:  Per pt self inventory pt reports sleeping poorly even with Trazadone at HS, appetite improving, energy level low, ability to pay attention poor, rates depression at a 5 out of 10 and hopelessness at a 2 out of 10, denies SI/HI/AVH.      A:  MD notified that pt has not been going to the cafeteria for meals and has a decreased appetite, notified NP that pt states that he did not sleep well last night even after receiving Trazadone at HS, pt states that "it didn't work",Emotional support provided, Encouraged pt to continue with treatment plan and attend all group activities, q15 min checks maintained for safety.  R:  Pt has been sleeping a lot during the day today, not going to group or meals even with encouragement from staff, pleasant with staff and other patients, tends to isolate in room.

## 2013-05-22 NOTE — Progress Notes (Signed)
D   Pt is pleasant on approach   He attends and participates in groups  He interacts well with others   He denise withdrawal symptoms and said he is feeling much better since he has gotten over feeling so drowsy   A   Verbal support given   Medications administered and effectiveness monitored   Q 15 min checks R   Pt safe at present

## 2013-05-22 NOTE — Tx Team (Signed)
Interdisciplinary Treatment Plan Update (Adult)  Date: 05/22/2013   Time Reviewed: 12:20 PM  Progress in Treatment:  Attending groups: No.  Participating in groups:  No.  Taking medication as prescribed: Yes  Tolerating medication: Yes  Family/Significant othe contact made: No. SPE not required for this pt.  Patient understands diagnosis: Yes, AEB seeking treatment for ETOH detox and mood stabilization.  Discussing patient identified problems/goals with staff: Yes  Medical problems stabilized or resolved: Yes  Denies suicidal/homicidal ideation: Yes during admission and self report.  Patient has not harmed self or Others: Yes  New problem(s) identified:  Discharge Plan or Barriers: Pt not attending d/c planning at this time. CSW assessing.  Additional comments: This is a 22 year old Caucasian male his first psychiatric admission to this hospital. Admitted as walk-in, brought in by boss with complaints of alcohol/drug intoxications requesting treatment. Patient reports, "My boss brought me to this hospital yesterday. I had told him that I needed help for my substance abuse problems. I also want to make sure that I will have a job after my rehabilitation treatment. 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 I'm pretty much homeless since August of this year. I live with my friends. It is my own fault that I'm homeless. I make 1500 dollars a month, I spend it all drugs and parties.I have been using drugs x 5 years, and THC x 10. Drugs make me feel complete, I thought. I'm an addict. I'm not happy when sober. My goal is to get clean. Get into a meaningful relationship with someone and stay sober. I'm very depressed, have been for a long time. I don't sleep at night and I don't have any appetite". Reason for Continuation of Hospitalization: Librium taper-withdrawals Mood stabilization Medication management  Estimated length of stay: 3-4 days  For review of initial/current patient  goals, please see plan of care.  Attendees:  Patient:    Family:    Physician: Geoffery Lyons MD 05/22/2013 12:20 PM   Nursing: Lupita Leash RN  05/22/2013 12:20 PM   Clinical Social Worker Ewald Beg Smart, LCSWA  05/22/2013 12:20 PM   Other: Sue Lush RN  05/22/2013 12:20 PM   Other: Harriett Sine RN 05/22/2013 12:20 PM   Other: Darden Dates Nurse CM  05/22/2013 12:20 PM   Other:    Scribe for Treatment Team:  Trula Slade LCSWA 05/22/2013 12:20 PM

## 2013-05-22 NOTE — Progress Notes (Signed)
Pt attended AA group and actively participated.  

## 2013-05-22 NOTE — Progress Notes (Signed)
Patient ID: Kristopher Hess, male   DOB: [birth year], 22 y.o.   MRN: [id number]  D: Patient in hallway reading the information on the wall. Reports not having any withdrawal symptoms at present. States that his main issue is cocaine. Currently denies depressed mood or SI at this time. Did talk about the sleep medication making him really drowsy this am.  A: Staff will monitor on q 15 minute checks, follow treatment plan, and give meds as ordered. R: Denies any issues at present. No complaints.

## 2013-05-22 NOTE — [facility] Group Notes (Signed)
[facility] LCSW Aftercare Discharge Planning Group Note   05/22/2013 12:19 PM  Participation Quality:  DID NOT ATTEND   Smart, HeatherLCSWA

## 2013-05-22 NOTE — [facility] Suicide Risk Assessment (Signed)
[facility] INPATIENT: Family/Significant Other Suicide Prevention Education  Suicide Prevention Education:  Education Completed; No one has been identified by the patient as the family member/significant other with whom the patient will be residing, and identified as the person(s) who will aid the patient in the event of a mental health crisis (suicidal ideations/suicide attempt).   Pt did not c/o SI at admission, nor have they endorsed SI during their stay here. SPE not required. SPI pamphlet provided to pt and he was encouraged to share information with support network, ask questions, and talk about any concerns.   [employer], LCSWA 05/22/2013 2:12 PM

## 2013-05-22 NOTE — [facility] Suicide Risk Assessment (Signed)
[facility] INPATIENT:  Family/Significant Other Suicide Prevention Education  Suicide Prevention Education:  Patient Refusal for Family/Significant Other Suicide Prevention Education: The patient Kristopher Hess has refused to provide written consent for family/significant other to be provided Family/Significant Other Suicide Prevention Education during admission and/or prior to discharge.  Physician notified.  SPE completed with pt. SPI pamphlet provided to pt and he was encouraged to share information with support network, ask questions, and talk about any concerns relating to SPE.   Smart, HeatherLCSWA  05/22/2013, 3:16 PM

## 2013-05-22 NOTE — Progress Notes (Signed)
[facility] MD Progress Note  05/22/2013 6:17 PM Kristopher Hess  MRN:  [id number] Subjective:  [name] states that he is trying to get his life back together. He admits he has been drinking increasingly out of control. He is concerned that he is not going to have too many second chances. Claims no support out there. He was found by his roommate with the gas stove on passed out on top of it. States he was intoxicated bud doubts it was accidental. States he was in a state of mind in which he would easily would have tried to kill himself. He had attempted previously by hanging himself from the balcony but the belt gave in.  Diagnosis:   DSM5: Schizophrenia Disorders:  none Obsessive-Compulsive Disorders:  none Trauma-Stressor Disorders:  none Substance/Addictive Disorders:  Alcohol Related Disorder - Severe (303.90) Depressive Disorders:  Major Depressive Disorder - Moderate (296.22)  Axis I: Substance Induced Mood Disorder  ADL's:  Intact  Sleep: Fair  Appetite:  Fair  Suicidal Ideation:  Plan:  denies Intent:  denies Means:  denies Homicidal Ideation:  Plan:  denies Intent:  denies Means:  denies AEB (as evidenced by):  Psychiatric Specialty Exam: Review of Systems  Constitutional: Negative.   HENT: Negative.   Eyes: Negative.   Respiratory: Negative.   Cardiovascular: Negative.   Gastrointestinal: Negative.   Genitourinary: Negative.   Musculoskeletal: Negative.   Skin: Negative.   Neurological: Negative.   Endo/Heme/Allergies: Negative.   Psychiatric/Behavioral: Positive for depression and substance abuse. The patient is nervous/anxious and has insomnia.     Blood pressure 108/74, pulse 80, temperature 97.1 F (36.2 C), temperature source Oral, resp. rate 16, height 6\' 2"  (1.88 m), weight 59.421 kg (131 lb).Body mass index is 16.81 kg/(m^2).  General Appearance: Disheveled  Eye Solicitor::  Fair  Speech:  Clear and Coherent  Volume:  Decreased  Mood:  Anxious, Depressed,  Hopeless and Worthless  Affect:  Restricted  Thought Process:  Coherent and Goal Directed  Orientation:  Full (Time, Place, and Person)  Thought Content:  symptoms, worries, concerns  Suicidal Thoughts:  No  Homicidal Thoughts:  No  Memory:  Immediate;   Fair Recent;   Fair Remote;   Fair  Judgement:  Fair  Insight:  Present and Shallow  Psychomotor Activity:  Restlessness  Concentration:  Fair  Recall:  Fair  Akathisia:  No  Handed:    AIMS (if indicated):     Assets:  Desire for Improvement  Sleep:  Number of Hours: 4.5   Current Medications: Current Facility-Administered Medications  Medication Dose Route Frequency Provider Last Rate Last Dose  . acetaminophen (TYLENOL) tablet 650 mg  650 mg Oral Q6H PRN Nanine Means, NP      . alum & mag hydroxide-simeth (MAALOX/MYLANTA) 200-200-20 MG/5ML suspension 30 mL  30 mL Oral Q4H PRN Nanine Means, NP      . chlordiazePOXIDE (LIBRIUM) capsule 25 mg  25 mg Oral Q6H PRN Nanine Means, NP      . chlordiazePOXIDE (LIBRIUM) capsule 25 mg  25 mg Oral TID Nanine Means, NP   25 mg at 05/22/13 1659   Followed by  . [START ON 05/23/2013] chlordiazePOXIDE (LIBRIUM) capsule 25 mg  25 mg Oral BH-qamhs Nanine Means, NP       Followed by  . [START ON 05/25/2013] chlordiazePOXIDE (LIBRIUM) capsule 25 mg  25 mg Oral Daily Nanine Means, NP      . feeding supplement (ENSURE COMPLETE) (ENSURE COMPLETE) liquid 237 mL  237  mL Oral BID BM Jeoffrey Massed, RD   237 mL at 05/22/13 1025  . hydrOXYzine (ATARAX/VISTARIL) tablet 25 mg  25 mg Oral Q6H PRN Nanine Means, NP      . loperamide (IMODIUM) capsule 2-4 mg  2-4 mg Oral PRN Nanine Means, NP      . magnesium hydroxide (MILK OF MAGNESIA) suspension 30 mL  30 mL Oral Daily PRN Nanine Means, NP      . mirtazapine (REMERON) tablet 15 mg  15 mg Oral QHS Sanjuana Kava, NP   15 mg at 05/21/13 2156  . multivitamin with minerals tablet 1 tablet  1 tablet Oral Daily Nanine Means, NP   1 tablet at 05/22/13 [phone]  . nicotine  polacrilex (NICORETTE) gum 2 mg  2 mg Oral PRN Rachael Fee, MD   2 mg at 05/22/13 1700  . ondansetron (ZOFRAN-ODT) disintegrating tablet 4 mg  4 mg Oral Q6H PRN Nanine Means, NP      . thiamine (B-1) injection 100 mg  100 mg Intramuscular Once Nanine Means, NP      . thiamine (VITAMIN B-1) tablet 100 mg  100 mg Oral Daily Nanine Means, NP   100 mg at 05/22/13 1191  . traZODone (DESYREL) tablet 100 mg  100 mg Oral QHS PRN Beau Fanny, FNP        Lab Results:  Results for orders placed during the hospital encounter of 05/20/13 (from the past 48 hour(s))  URINE RAPID DRUG SCREEN (HOSP PERFORMED)     Status: Abnormal   Collection Time    05/20/13  6:48 PM      Result Value Range   Opiates NONE DETECTED  NONE DETECTED   Cocaine POSITIVE (*) NONE DETECTED   Benzodiazepines NONE DETECTED  NONE DETECTED   Amphetamines NONE DETECTED  NONE DETECTED   Tetrahydrocannabinol POSITIVE (*) NONE DETECTED   Barbiturates NONE DETECTED  NONE DETECTED   Comment:            DRUG SCREEN FOR MEDICAL PURPOSES     ONLY.  IF CONFIRMATION IS NEEDED     FOR ANY PURPOSE, NOTIFY LAB     WITHIN 5 DAYS.                LOWEST DETECTABLE LIMITS     FOR URINE DRUG SCREEN     Drug Class       Cutoff (ng/mL)     Amphetamine      1000     Barbiturate      200     Benzodiazepine   200     Tricyclics       300     Opiates          300     Cocaine          300     THC              50  CBC     Status: None   Collection Time    05/20/13  7:10 PM      Result Value Range   WBC 6.0  4.0 - 10.5 K/uL   RBC 4.78  4.22 - 5.81 MIL/uL   Hemoglobin 15.6  13.0 - 17.0 g/dL   HCT 47.8  29.5 - 62.1 %   MCV 93.7  78.0 - 100.0 fL   MCH 32.6  26.0 - 34.0 pg   MCHC 34.8  30.0 - 36.0 g/dL   RDW  12.7  11.5 - 15.5 %   Platelets 150  150 - 400 K/uL  COMPREHENSIVE METABOLIC PANEL     Status: Abnormal   Collection Time    05/20/13  7:10 PM      Result Value Range   Sodium 135  135 - 145 mEq/L   Potassium 3.6  3.5 - 5.1 mEq/L    Chloride 97  96 - 112 mEq/L   CO2 27  19 - 32 mEq/L   Glucose, Bld 84  70 - 99 mg/dL   BUN 7  6 - 23 mg/dL   Creatinine, Ser 1.61  0.50 - 1.35 mg/dL   Calcium 9.3  8.4 - 09.6 mg/dL   Total Protein 7.9  6.0 - 8.3 g/dL   Albumin 4.4  3.5 - 5.2 g/dL   AST 21  0 - 37 U/L   ALT 21  0 - 53 U/L   Alkaline Phosphatase 52  39 - 117 U/L   Total Bilirubin 0.2 (*) 0.3 - 1.2 mg/dL   GFR calc non Af Amer >90  >90 mL/min   GFR calc Af Amer >90  >90 mL/min   Comment: (NOTE)     The eGFR has been calculated using the CKD EPI equation.     This calculation has not been validated in all clinical situations.     eGFR's persistently <90 mL/min signify possible Chronic Kidney     Disease.  ETHANOL     Status: None   Collection Time    05/20/13  7:10 PM      Result Value Range   Alcohol, Ethyl (B) <11  0 - 11 mg/dL   Comment:            LOWEST DETECTABLE LIMIT FOR     SERUM ALCOHOL IS 11 mg/dL     FOR MEDICAL PURPOSES ONLY    Physical Findings: AIMS: Facial and Oral Movements Muscles of Facial Expression: None, normal Lips and Perioral Area: None, normal Jaw: None, normal Tongue: None, normal,Extremity Movements Upper (arms, wrists, hands, fingers): None, normal Lower (legs, knees, ankles, toes): None, normal, Trunk Movements Neck, shoulders, hips: None, normal, Overall Severity Severity of abnormal movements (highest score from questions above): None, normal Incapacitation due to abnormal movements: None, normal Patient's awareness of abnormal movements (rate only patient's report): No Awareness, Dental Status Current problems with teeth and/or dentures?: No Does patient usually wear dentures?: No  CIWA:  CIWA-Ar Total: 3 COWS:     Treatment Plan Summary: Daily contact with patient to assess and evaluate symptoms and progress in treatment Medication management  Plan: Supportive approach/coping skills/relapse prevention           Detox, reassess and address the co morbidities            Explore rehab options Medical Decision Making Problem Points:  Review of psycho-social stressors (1) Data Points:  Review of medication regiment & side effects (2)  I certify that inpatient services furnished can reasonably be expected to improve the patient's condition.   Ellin Fitzgibbons A 05/22/2013, 6:17 PM

## 2013-05-22 NOTE — Progress Notes (Signed)
Patient didn't attend group. Patient sleep. 

## 2013-05-22 NOTE — [facility] Group Notes (Signed)
[facility] LCSW Group Therapy  05/22/2013 2:46 PM  Type of Therapy:  Group Therapy  Participation Level:  Did Not Attend-pt asleep/did not attend group   Smart, HeatherLCSWA  05/22/2013, 2:46 PM

## 2013-05-23 DIAGNOSIS — F1220 Cannabis dependence, uncomplicated: Secondary | ICD-10-CM

## 2013-05-23 DIAGNOSIS — F1410 Cocaine abuse, uncomplicated: Secondary | ICD-10-CM | POA: Diagnosis present

## 2013-05-23 NOTE — [facility] Group Notes (Signed)
Pt attended and participated in group  He was a bit distracted but overall was appropriate and engaged and sharing

## 2013-05-23 NOTE — Progress Notes (Signed)
D: Patient denies SI/HI and A/V hallucinations  A: Monitored q 15 minutes; patient encouraged to attend groups; patient educated about medications; patient given medications per physician orders; patient encouraged to express feelings and/or concerns  R: Patient is animated and cooperative; patient has no complaints of withdrawal; patient can be superficial; patient's interaction with staff and peers is appropriate; patient was able to set goal to talk with staff 1:1 when having feelings of SI; patient is taking medications as prescribed and tolerating medications; patient is attending some groups

## 2013-05-23 NOTE — Progress Notes (Signed)
[facility] MD Progress Note  05/23/2013 5:35 PM Kristopher Hess  MRN:  [id number] Subjective:  [place] continues to share memories of all the events he went trough while intoxicated.Expresses shame, regrets, guilt. Recognized this is a life style he does not want for himself. This is the first time he has come for treatment and states he wants this to be the last. States he would like to understand how he got to be so addicted and have so much craving. He has a strong family history of addictions and neglect. He states he carries an "emptiness" inside. Would like to go to [facility]. 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   HENT: Negative.   Eyes: Negative.   Respiratory: Negative.   Cardiovascular: Negative.   Gastrointestinal: Negative.   Genitourinary: Negative.   Musculoskeletal: Negative.   Skin: Negative.   Neurological: Negative.   Endo/Heme/Allergies: Negative.   Psychiatric/Behavioral: Positive for depression and substance abuse. The patient is nervous/anxious.     Blood pressure 132/88, pulse 69, temperature 97.4 F (36.3 C), temperature source Oral, resp. rate 16, height 6\' 2"  (1.88 m), weight 59.421 kg (131 lb).Body mass index is 16.81 kg/(m^2).  General Appearance: Fairly Groomed   Patent attorney::  Fair  Speech:  Clear and Coherent  Volume:  fluctuates  Mood:  Anxious, Depressed and shame, guilt  Affect:  anxious, worried, tery eyed at one time  Thought Process:  Coherent and Goal Directed  Orientation:  Full (Time, Place, and Person)  Thought Content:  worries, concerns, fear of relapsing, symptoms  Suicidal Thoughts:  No  Homicidal Thoughts:  No  Memory:  Immediate;   Fair Recent;   Fair Remote;   Fair  Judgement:  Fair  Insight:  Fair  Psychomotor Activity:  Restlessness  Concentration:  Fair  Recall:  Fair  Akathisia:  NA  Handed:    AIMS (if indicated):     Assets:  Desire for Improvement  Sleep:  Number of Hours: 5.25   Current Medications: Current Facility-Administered Medications  Medication Dose Route Frequency Provider Last Rate Last Dose  . acetaminophen (TYLENOL) tablet 650 mg  650 mg Oral Q6H PRN Nanine Means, NP      . alum & mag hydroxide-simeth (MAALOX/MYLANTA) 200-200-20 MG/5ML suspension 30 mL  30 mL Oral Q4H PRN Nanine Means, NP      . chlordiazePOXIDE (LIBRIUM) capsule 25 mg  25 mg Oral Q6H PRN Nanine Means, NP      . chlordiazePOXIDE (LIBRIUM) capsule 25 mg  25 mg Oral BH-qamhs Nanine Means, NP       Followed by  . [START ON 05/25/2013] chlordiazePOXIDE (LIBRIUM) capsule 25 mg  25 mg Oral Daily Nanine Means, NP      . feeding supplement (ENSURE COMPLETE) (ENSURE COMPLETE) liquid 237 mL  237 mL Oral BID BM Jeoffrey Massed, RD  237 mL at 05/23/13 1400  . hydrOXYzine (ATARAX/VISTARIL) tablet 25 mg  25 mg Oral Q6H PRN Nanine Means, NP      . loperamide (IMODIUM) capsule 2-4 mg  2-4 mg Oral PRN Nanine Means, NP      . magnesium hydroxide (MILK OF MAGNESIA) suspension 30 mL  30 mL Oral Daily PRN Nanine Means, NP      . mirtazapine (REMERON) tablet 15 mg  15 mg Oral QHS Sanjuana Kava, NP   15 mg at 05/22/13 2202  . multivitamin with minerals tablet 1 tablet  1 tablet Oral Daily Nanine Means, NP   1 tablet at 05/23/13 0846  . nicotine  polacrilex (NICORETTE) gum 2 mg  2 mg Oral PRN Rachael Fee, MD   2 mg at 05/23/13 1128  . ondansetron (ZOFRAN-ODT) disintegrating tablet 4 mg  4 mg Oral Q6H PRN Nanine Means, NP      . thiamine (B-1) injection 100 mg  100 mg Intramuscular Once Nanine Means, NP      . thiamine (VITAMIN B-1) tablet 100 mg  100 mg Oral Daily Nanine Means, NP   100 mg at 05/23/13 0845  . traZODone (DESYREL) tablet 100 mg  100 mg Oral QHS PRN Beau Fanny, FNP   100 mg at 05/22/13 2202    Lab Results: No results found for this or any previous visit (from the past 48 hour(s)).  Physical Findings: AIMS: Facial and Oral Movements Muscles of Facial Expression: None, normal Lips and Perioral Area: None, normal Jaw: None, normal Tongue: None, normal,Extremity Movements Upper (arms, wrists, hands, fingers): None, normal Lower (legs, knees, ankles, toes): None, normal, Trunk Movements Neck, shoulders, hips: None, normal, Overall Severity Severity of abnormal movements (highest score from questions above): None, normal Incapacitation due to abnormal movements: None, normal Patient's awareness of abnormal movements (rate only patient's report): No Awareness, Dental Status Current problems with teeth and/or dentures?: No Does patient usually wear dentures?: No  CIWA:  CIWA-Ar Total: 0 COWS:     Treatment Plan Summary: Daily contact with patient to assess and evaluate symptoms and progress in treatment Medication management  Plan: Supportive approach/coping skills/relapse prevention           Reassess and address the co morbidities  Medical Decision Making Problem Points:  Review of psycho-social stressors (1) Data Points:  Review of medication regiment & side effects (2) Review of new medications or change in dosage (2)  I certify that inpatient services furnished can reasonably be expected to improve the patient's condition.   Vernecia Umble A 05/23/2013, 5:35 PM

## 2013-05-23 NOTE — Progress Notes (Signed)
The focus of this group is to educate the patient on the purpose and policies of crisis stabilization and provide a format to answer questions about their admission.  The group details unit policies and expectations of patients while admitted.  Patient did not attend group. 

## 2013-05-23 NOTE — [facility] Group Notes (Signed)
[facility] LCSW Group Therapy  05/23/2013 1:41 PM  Type of Therapy:  Group Therapy  Participation Level:  Active  Participation Quality:  Attentive  Affect:  Flat  Cognitive:  Alert and Oriented  Insight:  Improving  Engagement in Therapy:  Improving  Modes of Intervention:  Confrontation, Discussion, Education, Socialization and Support  Summary of Progress/Problems: MHA Speaker came to talk about his personal journey with substance abuse and addiction. The pt processed ways by which to relate to the speaker. MHA speaker provided handouts and educational information pertaining to groups and services offered by the [facility]. Kristopher Hess was attentive and engaged and attentive throughout today's therapy group. He actively listened and asked speaker questions about his life. Kristopher Hess followed along as speaker reviewed various groups and services offered by [facility]. Kristopher Hess stated that he plans to attend groups at [facility].    Smart, HeatherLCSWA 05/23/2013, 1:41 PM

## 2013-05-23 NOTE — Progress Notes (Signed)
Adult Psychoeducational Group Note  Date:  05/23/2013 Time:  1:34 PM  Group Topic/Focus:  Recovery Goals:   The focus of this group is to identify appropriate goals for recovery and establish a plan to achieve them.  Participation Level:  Active  Participation Quality:  Appropriate and Attentive  Affect:  Appropriate  Cognitive:  Alert and Appropriate  Insight: Good  Engagement in Group:  Engaged  Modes of Intervention:  Activity, Discussion, Education, Exploration, Socialization and Support  Additional Comments:  Pt came to group and shared that his ego and pride are standing between him and recovery. Pt plans on changing this by going to [facility] and realizing the reality of his situation and accepting the help from his friend.   Cathlean Cower 05/23/2013, 1:34 PM

## 2013-05-24 DIAGNOSIS — F1410 Cocaine abuse, uncomplicated: Secondary | ICD-10-CM

## 2013-05-24 DIAGNOSIS — F1920 Other psychoactive substance dependence, uncomplicated: Secondary | ICD-10-CM

## 2013-05-24 NOTE — Progress Notes (Signed)
Patient ID: Kristopher Hess, male   DOB: 11-26-1990, 23 y.o.   MRN: [id number] Pt attended Pharmacy group.

## 2013-05-24 NOTE — [facility] Group Notes (Signed)
[facility] LCSW Aftercare Discharge Planning Group Note   05/24/2013 8:45 AM  Participation Quality:  Alert, Appropriate and Oriented  Mood/Affect:  Anxious  Depression Rating:  5  Anxiety Rating:  7  Thoughts of Suicide:  Pt denies SI/HI  Will you contract for safety?   Yes  Current AVH:  Pt denies  Plan for Discharge/Comments:  Pt attended discharge planning group and actively participated in group.  CSW provided pt with today's workbook.  Pt reports feeling very anxious today.  Pt states that he is hopeful to go to [facility] for further inpatient treatment from here.  Pt reports not feeling able to d/c home before going to treatment first.  CSW is working on getting pt into [facility].  No further needs voiced by pt at this time.    Transportation Means: Pt reports access to transportation  Supports: No supports mentioned at this time  Kristopher Ivan, LCSW 05/24/2013 10:03 AM

## 2013-05-24 NOTE — Progress Notes (Addendum)
D: Patient denies SI/HI and auditory and visual hallucinations. The patient appears guarded during interactions with staff.  Patient met with MD this morning as well. He rates depression at 5/ anxiety 8/ and hopelessness at 2/10. (1 best).  A: Patient given emotional support from RN. Patient encouraged to come to staff with concerns and/or questions. Patient's medication routine continued. Patient's orders and plan of care reviewed. Nicotine gum administered for nicotine craving.  R: Patient remains appropriate and cooperative but depressed. Patient denies any concerns or needs at this time. Will continue to monitor patient q15 minutes for safety. Patient voiced hope that he will be able to get in to [facility] at discharge.

## 2013-05-24 NOTE — Progress Notes (Addendum)
(  Discharging tomorrow, Thursday 05/25/2013) [facility] Adult Case Management Discharge Plan :  Will you be returning to the same living situation after discharge: Yes,  can return home after treatment At discharge, do you have transportation home?:Yes,  [facility] will pick pt up Do you have the ability to pay for your medications:Yes,  access to meds  Release of information consent forms completed and in the chart;  Patient's signature needed at discharge.  Patient to Follow up at: Follow-up Information   Follow up with [facility] On 05/25/2013. (Will be picked up at 3:00 pm for further inpatient treatment. Auth # [id number])    Contact information:   [address] [place], [place] [address] Phone: [phone] Fax: [phone]      Follow up with [facility]. (Walk in for hospital discharge appointment, walk in clinic is Monday - Friday 8 am - 3 pm.  They will than schedule you for medication management)    Contact information:   [address], [place] [address] Phone: [phone] Fax: [phone]      Patient denies SI/HI:   Yes,  denies SI/HI    Safety Planning and Suicide Prevention discussed:  Yes,  discussed with pt.  Pt refused consent to contact family/friend.  See suicide prevention education note.    Carmina Miller 05/24/2013, 4:44 PM

## 2013-05-24 NOTE — Progress Notes (Signed)
Patient ID: Kristopher Hess, male   DOB: [birth year], 22 y.o.   MRN: [id number] Patients' were encouraged to reflect on 2014 and consider what they want to do differently in 2015. This patient stated: I want to focus on sobriety, get in a better financial place and get my apartment and car back.

## 2013-05-24 NOTE — [facility] Group Notes (Signed)
[facility] LCSW Group Therapy  05/24/2013  1:15 PM   Type of Therapy:  Group Therapy  Participation Level:  Active  Participation Quality:  Attentive, Sharing and Supportive  Affect:  Depressed and Flat  Cognitive:  Alert and Oriented  Insight:  Developing/Improving and Engaged  Engagement in Therapy:  Developing/Improving and Engaged  Modes of Intervention:  Clarification, Confrontation, Discussion, Education, Exploration, Limit-setting, Orientation, Problem-solving, Rapport Building, Dance movement psychotherapist, Socialization and Support  Summary of Progress/Problems: The topic for group today was emotional regulation.  This group focused on both positive and negative emotion identification and allowed group members to process ways to identify feelings, regulate negative emotions, and find healthy ways to manage internal/external emotions. Group members were asked to reflect on a time when their reaction to an emotion led to a negative outcome and explored how alternative responses using emotion regulation would have benefited them. Group members were also asked to discuss a time when emotion regulation was utilized when a negative emotion was experienced.  Pt shared that he has to regulate his emotions at work at [employer], but struggles with it at times.  Pt was able to process how acting on his emotions has gotten him into trouble at times.  Pt actively participated and was engaged in group discussion, and was supportive to peers.    Reyes Ivan, LCSW 05/24/2013 3:09 PM

## 2013-05-24 NOTE — Progress Notes (Signed)
Patient ID: Kristopher Hess, male   DOB: 01/13/1991, 22 y.o.   MRN: [id number] [facility] MD Progress Note  05/24/2013 2:31 PM Kristopher Hess  MRN:  [id number]  Subjective: Kristopher Hess reports that he is doing all right. He says although he has his mind in doing the right thing and getting better, he is still craving alcohol and cocaine. He says his appetite remains poor, but he is supplementing with ensure. He says his sleep is improving. Kristopher Hess states that he is here to get well and he is determined to do so for a lot of people believed in him. He adds that he is looking forward to some long term treatment program after discharge.  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  Psychiatric Specialty Exam: Review of Systems  Constitutional: Negative.   HENT: Negative.   Eyes: Negative.   Respiratory: Negative.   Cardiovascular: Negative.   Gastrointestinal: Negative.   Genitourinary: Negative.   Musculoskeletal: Negative.   Skin: Negative.   Neurological: Negative.   Endo/Heme/Allergies: Negative.   Psychiatric/Behavioral: Positive for depression and substance abuse. The patient is nervous/anxious.     Blood pressure 126/82, pulse 69, temperature 97.2 F (36.2 C), temperature source Oral, resp. rate 16, height 6\' 2"  (1.88 m), weight 59.421 kg (131 lb).Body mass index is 16.81 kg/(m^2).  General Appearance: Fairly Groomed  Patent attorney::  Fair  Speech:  Clear and  Coherent  Volume:  fluctuates  Mood:  Anxious, Depressed and shame, guilt  Affect:  anxious, worried, tery eyed at one time  Thought Process:  Coherent and Goal Directed  Orientation:  Full (Time, Place, and Person)  Thought Content:  worries, concerns, fear of relapsing, symptoms  Suicidal Thoughts:  No  Homicidal Thoughts:  No  Memory:  Immediate;   Fair Recent;   Fair Remote;   Fair  Judgement:  Fair  Insight:  Fair  Psychomotor Activity:  Restlessness  Concentration:  Fair  Recall:  Fair  Akathisia:  NA  Handed:    AIMS (if indicated):     Assets:  Desire for Improvement  Sleep:  Number of Hours: 4.25   Current Medications: Current Facility-Administered Medications  Medication Dose Route Frequency Provider Last Rate Last Dose  . acetaminophen (TYLENOL) tablet 650 mg  650 mg Oral Q6H PRN Nanine Means, NP      . alum & mag hydroxide-simeth (MAALOX/MYLANTA) 200-200-20 MG/5ML suspension 30 mL  30 mL Oral Q4H PRN Nanine Means, NP      . Melene Muller ON 05/25/2013] chlordiazePOXIDE (LIBRIUM) capsule 25 mg  25 mg Oral Daily Nanine Means, NP      . feeding supplement (ENSURE COMPLETE) (ENSURE COMPLETE) liquid 237 mL  237 mL Oral BID BM Jeoffrey Massed, RD   237 mL at 05/24/13 1255  . magnesium hydroxide (MILK OF MAGNESIA) suspension 30 mL  30 mL Oral Daily PRN Nanine Means, NP      . mirtazapine (REMERON) tablet 15 mg  15 mg Oral QHS Sanjuana Kava, NP  15 mg at 05/23/13 2211  . multivitamin with minerals tablet 1 tablet  1 tablet Oral Daily Nanine Means, NP   1 tablet at 05/24/13 [phone]  . nicotine polacrilex (NICORETTE) gum 2 mg  2 mg Oral PRN Rachael Fee, MD   2 mg at 05/24/13 9604  . thiamine (B-1) injection 100 mg  100 mg Intramuscular Once Nanine Means, NP      . thiamine (VITAMIN B-1) tablet 100 mg  100 mg Oral Daily Nanine Means, NP   100 mg at 05/24/13 0824  . traZODone (DESYREL) tablet 100 mg  100 mg Oral QHS PRN Beau Fanny, FNP   100 mg at 05/23/13 2211    Lab Results: No  results found for this or any previous visit (from the past 48 hour(s)).  Physical Findings: AIMS: Facial and Oral Movements Muscles of Facial Expression: None, normal Lips and Perioral Area: None, normal Jaw: None, normal Tongue: None, normal,Extremity Movements Upper (arms, wrists, hands, fingers): None, normal Lower (legs, knees, ankles, toes): None, normal, Trunk Movements Neck, shoulders, hips: None, normal, Overall Severity Severity of abnormal movements (highest score from questions above): None, normal Incapacitation due to abnormal movements: None, normal Patient's awareness of abnormal movements (rate only patient's report): No Awareness, Dental Status Current problems with teeth and/or dentures?: No Does patient usually wear dentures?: No  CIWA:  CIWA-Ar Total: 3 COWS:     Treatment Plan Summary: Daily contact with patient to assess and evaluate symptoms and progress in treatment Medication management  Plan: Supportive approach/coping skills/relapse prevention Reassess and address the co morbidities. Continue current plan of care.  Medical Decision Making Problem Points:  Review of psycho-social stressors (1) Data Points:  Review of medication regiment & side effects (2) Review of new medications or change in dosage (2)  I certify that inpatient services furnished can reasonably be expected to improve the patient's condition.   Sanjuana Kava, PMHNP, FNP-BC 05/24/2013, 2:31 PM Agree with assessment and plan Madie Reno A. Dub Mikes, M.D.

## 2013-05-24 NOTE — Tx Team (Signed)
Interdisciplinary Treatment Plan Update (Adult)  Date: 05/24/2013  Time Reviewed:  9:45 AM  Progress in Treatment: Attending groups: Yes Participating in groups:  Yes Taking medication as prescribed:  Yes Tolerating medication:  Yes Family/Significant othe contact made: No, pt refused Patient understands diagnosis:  Yes Discussing patient identified problems/goals with staff:  Yes Medical problems stabilized or resolved:  Yes Denies suicidal/homicidal ideation: Yes Issues/concerns per patient self-inventory:  Yes Other:  New problem(s) identified: N/A  Discharge Plan or Barriers: CSW is working on getting pt into [facility] for further inpatient treatment.   Reason for Continuation of Hospitalization: Anxiety Depression Medication Stabilization  Comments: N/A  Estimated length of stay: 2-3 days  For review of initial/current patient goals, please see plan of care.  Attendees: Patient:     Family:     Physician:  Dr. Dub Mikes 05/24/2013 10:17 AM   Nursing:   Roswell Miners, RN 05/24/2013 10:17 AM   Clinical Social Worker:  Reyes Ivan, LCSW 05/24/2013 10:17 AM   Other: Onnie Boer, RN case manager 05/24/2013 10:17 AM   Other:  Trula Slade, LCSWA 05/24/2013 10:17 AM   Other:  Serena Colonel, NP 05/24/2013 10:17 AM   Other:  Robbie Louis, RN 05/24/2013 10:18 AM   Other:    Other:    Other:    Other:    Other:    Other:     Scribe for Treatment Team:   Carmina Miller, 05/24/2013 , 10:17 AM

## 2013-05-25 MED ORDER — MIRTAZAPINE 15 MG PO TABS: 15.0000 mg | ORAL_TABLET | Freq: Every day | ORAL | Status: DC

## 2013-05-25 MED ORDER — TRAZODONE HCL 100 MG PO TABS: 100.0000 mg | ORAL_TABLET | Freq: Every evening | ORAL | Status: DC | PRN

## 2013-05-25 MED ORDER — TRAZODONE HCL 100 MG PO TABS
100.0000 mg | ORAL_TABLET | Freq: Once | ORAL | Status: AC
  Administered 2013-05-25: 100 mg via ORAL
  Filled 2013-05-25: qty 1

## 2013-05-25 NOTE — Progress Notes (Signed)
Adult Psychoeducational Group Note  Date:  05/25/2013 Time:  9:15am Group Topic/Focus:  Nursing Group  Participation Level:  Active  Participation Quality:  Appropriate and Attentive  Affect:  Appropriate  Cognitive:  Alert and Appropriate  Insight: Appropriate  Engagement in Group:  Engaged  Modes of Intervention:  Discussion  Additional Comments:  Pt was attentive anda appropriate during today's morning Wellness group with nursing staff.    Bing PlumeScott, Lillian Ballester D 05/25/2013, 1:32 PM

## 2013-05-25 NOTE — [facility] Suicide Risk Assessment (Signed)
Suicide Risk Assessment  Discharge Assessment     Demographic Factors:  Male, Adolescent or young adult and Caucasian  Mental Status Per Nursing Assessment::   On Admission:     Current Mental Status by Physician: In full contact with reality. There are no suicidal ideas, plans or intent. His mood is euthymic, his affect is appropriate. There are no active S/S of withdrawal. He is willing and motivated to pursue further outpatient treatment. [facility].    Loss Factors: NA  Historical Factors: NA  Risk Reduction Factors:   Employed and Positive social support  Continued Clinical Symptoms:  Alcohol/Substance Abuse/Dependencies  Cognitive Features That Contribute To Risk:  Polarized thinking Thought constriction (tunnel vision)    Suicide Risk:  Minimal: No identifiable suicidal ideation.  Patients presenting with no risk factors but with morbid ruminations; may be classified as minimal risk based on the severity of the depressive symptoms  Discharge Diagnoses:   AXIS I:  Alcohol Dependence, Cocaine Abuse, Cannabis Dependence AXIS II:  Deferred AXIS III:   Past Medical History  Diagnosis Date  . Mental disorder   . Depression    AXIS IV:  other psychosocial or environmental problems AXIS V:  61-70 mild symptoms  Plan Of Care/Follow-up recommendations:  Activity:  as tolerated Diet:  regular Follow up [facility] Is patient on multiple antipsychotic therapies at discharge:  No   Has Patient had three or more failed trials of antipsychotic monotherapy by history:  No  Recommended Plan for Multiple Antipsychotic Therapies: NA  Hai Grabe A 05/25/2013, 2:10 PM

## 2013-05-25 NOTE — Progress Notes (Signed)
Discharge Note:  [facility].  Patient denied SI and HI.  Denied A/V hallucinations.  Denied pain.  Patient stated he received all his belongings, clothing, toiletries, miscellaneous items, shoes, coat, phone, charger, wallet, money, cards, etc.  Suicide prevention information given and discussed with patient who stated he understood and had no questions.  Patient stated he [facility].  Patient has been cooperative and pleasant today.

## 2013-05-25 NOTE — Progress Notes (Signed)
Pt attended AA group 

## 2013-05-25 NOTE — Discharge Summary (Signed)
Physician Discharge Summary Note  Patient:  Kristopher Hess is an 23 y.o., male MRN:  [id number] DOB:  1991/03/20 Patient phone:  [phone] (home)  Patient address:   [place] [address],   Date of Admission:  05/20/2013 Date of Discharge: 05/25/13  Reason for Admission: Alcohol/drug detox  Discharge Diagnoses: Principal Problem:   Alcohol dependence Active Problems:   Alcohol abuse   Cannabis dependence   Cocaine abuse  Review of Systems  Constitutional: Negative.   HENT: Negative.   Eyes: Negative.   Respiratory: Negative.   Cardiovascular: Negative.   Gastrointestinal: Negative.   Genitourinary: Negative.   Musculoskeletal: Negative.   Skin: Negative.   Neurological: Negative.   Endo/Heme/Allergies: Negative.   Psychiatric/Behavioral: Positive for depression (Stabilized with medication prior to discharge). Negative for suicidal ideas, hallucinations (Stabilized with medication prior to discharge), memory loss and substance abuse. The patient is nervous/anxious and has insomnia (Stabilized with medication prior to discharge).     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 Major depressive disorder, moderate AXIS II:  Deferred AXIS III:   Past Medical History  Diagnosis Date  . Mental disorder   . Depression    AXIS IV:  problems related to legal system/crime and Polysubstance dependence AXIS V:  64  Level of Care:  [facility]  Hospital Course:  This is a 23 year old Caucasian male his first psychiatric admission to this hospital. Admitted as walk-in, brought in by boss with complaints of  alcohol/drug intoxications requesting treatment. Patient reports, "My boss brought me to this hospital yesterday. I had told him that I needed help for my substance abuse problems. I also want to make sure that I will have a job after my rehabilitation treatment. This is why I had to come clean before my boss and Economist. I'm pretty much homeless since August of this year. I live with my friends. It is my own fault that I'm homeless. I make 1500 dollars a month, I spend it all drugs and parties.I have been using drugs x 5 years, and THC x 10. Drugs make me feel complete, I thought. I'm an addict. I'm not happy when sober. My goal is to get clean. Get into a meaningful relationship with someone and stay sober. I'm very depressed, have been for a long time. I don't sleep at night and I don't have any appetite".  Upon admission into this hospital and after admission assessment/evaluation coupled with UDS/toxicology reports, it was determined that patient will need detoxification treatment protocols to re-stabilize his systems of drug intoxication and to combat the withdrawal symptoms of these substances as well. And his discharge plans included a referral to a long term treatment facility ([facility] treatment center) for more intense substance abuse treatment. Mr. Fleener was then started on Librium detoxification treatment protocols. He was also enrolled in group counseling sessions and activities where he was counseled, taught and learned coping skills that should help him after discharge to cope better, manage his substance abuse problems to maintain a much longer sobriety. He also was enrolled and attended AA/NA meetings being offered and held on this unit. He presented no other previous and or identifiable medical  conditions that required treatment and or monitoring. However, he was monitored closely for any potential problems that may arise as a result of and or during detoxification treatment. Patient tolerated  his detoxification treatment protocols without any significant adverse effects and or reactions reported.  Besides detoxification treatment, Kwesi also was ordered and received Mirtazapine 15 mg Q bedtime for depression, sleep/appetite enhancement and Trazodone 100 mg Q bedtime for sleep.  Patient attended treatment team meeting this am and met with the team. His reason for admission, present symptoms, substance abuse issues, response to treatment and discharge plans discussed. Patient endorsed that he is doing well and stable for discharge to pursue the next phase of his substance abuse treatment. It was then agreed upon between patient and the team that he will be discharged to  [facility] treatment center for further substance abuse treatment.  He was also encouraged to join/attend AA/NA meetings being offered and held within his community after his rehabilitation drug treatment. He is instructed and encouraged to get a trusted sponsor from the advise of others or from whomever within the [place] meetings seems to make sense, and who has a proven track record, and will hold him responsible for his sobriety, and both expects and insists on his total abstinence from drugs. Upon discharge, Kipp adamantly denies suicidal, homicidal ideations, auditory, visual hallucinations, delusional thoughts and or withdrawal symptoms. Patient left [facility] with all personal belongings in no apparent distress. He received 2 weeks worth supply samples of his [facility] discharge medications. Transportation per [employer].   Consults:  psychiatry  Significant Diagnostic Studies:  labs: CBC with diff, CMP, UDS, toxicology tests, U/A  Discharge Vitals:   Blood pressure 100/68, pulse 98, temperature 97.2 F (36.2 C), temperature source Oral, resp. rate 18, height '6\' 2"'  (1.88 m), weight 59.421 kg (131 lb). Body mass index is 16.81 kg/(m^2). Lab Results:   No results found for this or any previous visit (from the past 72 hour(s)).  Physical  Findings: AIMS: Facial and Oral Movements Muscles of Facial Expression: None, normal Lips and Perioral Area: None, normal Jaw: None, normal Tongue: None, normal,Extremity Movements Upper (arms, wrists, hands, fingers): None, normal Lower (legs, knees, ankles, toes): None, normal, Trunk Movements Neck, shoulders, hips: None, normal, Overall Severity Severity of abnormal movements (highest score from questions above): None, normal Incapacitation due to abnormal movements: None, normal Patient's awareness of abnormal movements (rate only patient's report): No Awareness, Dental Status Current problems with teeth and/or dentures?: No Does patient usually wear dentures?: No  CIWA:  CIWA-Ar Total: 3 COWS:     Psychiatric Specialty Exam: See Psychiatric Specialty Exam and Suicide Risk Assessment completed by Attending Physician prior to discharge.  Discharge destination:  [facility]  Is patient on multiple antipsychotic therapies at discharge:  No   Has Patient had three or more failed trials of antipsychotic monotherapy by history:  No  Recommended Plan for Multiple Antipsychotic Therapies: NA     Medication List    STOP taking these medications       cetirizine-pseudoephedrine 5-120 MG per tablet  Commonly known as:  ZYRTEC-D     guaiFENesin 600 MG 12 hr tablet  Commonly known as:  MUCINEX      TAKE these medications     Indication   mirtazapine 15 MG tablet  Commonly known as:  REMERON  Take 1 tablet (15 mg total) by mouth at bedtime. For depression/sleep   Indication:  Trouble Sleeping, Major Depressive Disorder     traZODone 100  MG tablet  Commonly known as:  DESYREL  Take 1 tablet (100 mg total) by mouth at bedtime as needed for sleep.   Indication:  Trouble Sleeping       Follow-up Information   Follow up with [facility] On 05/25/2013. (Will be picked up at 3:00 pm for further inpatient treatment. Auth # [id number])    Contact information:   [address] [place],  [place] [address] Phone: [phone] Fax: [phone]      Follow up with [facility]. (Walk in for hospital discharge appointment, walk in clinic is Monday - Friday 8 am - 3 pm.  They will than schedule you for medication management)    Contact information:   [address], [place] [address] Phone: [phone] Fax: [phone]     Follow-up recommendations:  Activity:  As tolerated Diet: As recommended by your primary care doctor. Keep all scheduled follow-up appointments as recommended. Continue to work your relapse prevention plan Comments: Take all your medications as prescribed by your mental healthcare provider. Report any adverse effects and or reactions from your medicines to your outpatient provider promptly. Patient is instructed and cautioned to not engage in alcohol and or illegal drug use while on prescription medicines. In the event of worsening symptoms, patient is instructed to call the crisis hotline, 911 and or go to the nearest ED for appropriate evaluation and treatment of symptoms. Follow-up with your primary care provider for your other medical issues, concerns and or health care needs.   Total Discharge Time:  Greater than 30 minutes.  Signed: Encarnacion Slates, PMHNP, FNP-BC 05/25/2013, 10:54 AM Agree with assessment and plan Woodroe Chen. Sabra Heck, M.D.

## 2013-05-25 NOTE — Progress Notes (Signed)
The focus of this group is to educate the patient on the purpose and policies of crisis stabilization and provide a format to answer questions about their admission.  The group details unit policies and expectations of patients while admitted.  Patient attended 0900 nurse education orientation group this morning.  Patient listened attentively, appropriate affect, alert, appropriate insight and engagement.  Today patient will work on 3 goals for discharge.  

## 2013-05-25 NOTE — Progress Notes (Signed)
D:  Patient's self inventory sheet, patient sleeps well, improving appetite, normal energy level, improving attention span.  Rated depression 9, denied hopeless, anxiety 5.   Denied withdrawals.  Denied SI.   Does have discharge plans.  No problems taking meds after discharge. A:  Medications administered per MD orders.  Emotional support and encouragement given patient. R:  Denied SI and HI.  Denied A/V hallucinations.  Will continue to monitor patient for safety with 15 minute checks.  Safety maintained.

## 2013-05-25 NOTE — Progress Notes (Signed)
D: Pt is appropriate in affect and anxious in mood. Pt is active within the milieu. Pt verbalizes an overall improvement in his anxiety and depression. Pt present for AA this evening.  Pt observed interacting appropriately within the milieu. Pt is denying any suicidal ideation at this time.  A: Writer administered scheduled and prn medications to pt. Continued support and availability as needed was extended to this pt. Staff continue to monitor pt with q7015min checks.  R: No adverse drug reactions noted. Pt receptive to treatment. Pt remains safe at this time.

## 2013-05-30 NOTE — Progress Notes (Signed)
Patient Discharge Instructions:  After Visit Summary (AVS):   Faxed to:  05/30/13 Discharge Summary Note:   Faxed to:  05/30/13 Psychiatric Admission Assessment Note:   Faxed to:  05/30/13 Suicide Risk Assessment - Discharge Assessment:   Faxed to:  05/30/13 Faxed/Sent to the Next Level Care provider:  05/30/13 [facility] @ [phone] [facility] to [facility] @ [phone]  Jerelene ReddenSheena E Leith-Hatfield, 05/30/2013, 3:28 PM

## 2014-02-17 ENCOUNTER — Encounter (HOSPITAL_COMMUNITY): Payer: Self-pay | Admitting: Emergency Medicine

## 2014-02-17 ENCOUNTER — Emergency Department (HOSPITAL_COMMUNITY)
Admission: EM | Admit: 2014-02-17 | Discharge: 2014-02-17 | Disposition: A | Discharge status: Home / Self Care | Payer: Self-pay | Attending: Emergency Medicine | Admitting: Emergency Medicine

## 2014-02-17 DIAGNOSIS — Y9289 Other specified places as the place of occurrence of the external cause: Secondary | ICD-10-CM | POA: Insufficient documentation

## 2014-02-17 DIAGNOSIS — F329 Major depressive disorder, single episode, unspecified: Secondary | ICD-10-CM | POA: Insufficient documentation

## 2014-02-17 DIAGNOSIS — T43591A Poisoning by other antipsychotics and neuroleptics, accidental (unintentional), initial encounter: Secondary | ICD-10-CM | POA: Insufficient documentation

## 2014-02-17 DIAGNOSIS — F1010 Alcohol abuse, uncomplicated: Secondary | ICD-10-CM | POA: Insufficient documentation

## 2014-02-17 DIAGNOSIS — T43204A Poisoning by unspecified antidepressants, undetermined, initial encounter: Secondary | ICD-10-CM | POA: Insufficient documentation

## 2014-02-17 DIAGNOSIS — F17200 Nicotine dependence, unspecified, uncomplicated: Secondary | ICD-10-CM | POA: Insufficient documentation

## 2014-02-17 DIAGNOSIS — F3289 Other specified depressive episodes: Secondary | ICD-10-CM | POA: Insufficient documentation

## 2014-02-17 DIAGNOSIS — Z79899 Other long term (current) drug therapy: Secondary | ICD-10-CM | POA: Insufficient documentation

## 2014-02-17 DIAGNOSIS — T50904A Poisoning by unspecified drugs, medicaments and biological substances, undetermined, initial encounter: Secondary | ICD-10-CM

## 2014-02-17 DIAGNOSIS — Y9389 Activity, other specified: Secondary | ICD-10-CM | POA: Insufficient documentation

## 2014-02-17 DIAGNOSIS — F10920 Alcohol use, unspecified with intoxication, uncomplicated: Secondary | ICD-10-CM

## 2014-02-17 LAB — CBC WITH DIFFERENTIAL/PLATELET
Basophils Absolute: 0 10*3/uL (ref 0.0–0.1)
Basophils Relative: 1 % (ref 0–1)
EOS PCT: 1 % (ref 0–5)
Eosinophils Absolute: 0.1 10*3/uL (ref 0.0–0.7)
HEMATOCRIT: 43 % (ref 39.0–52.0)
HEMOGLOBIN: 15.1 g/dL (ref 13.0–17.0)
LYMPHS PCT: 36 % (ref 12–46)
Lymphs Abs: 3.1 10*3/uL (ref 0.7–4.0)
MCH: 32.3 pg (ref 26.0–34.0)
MCHC: 35.1 g/dL (ref 30.0–36.0)
MCV: 92.1 fL (ref 78.0–100.0)
MONO ABS: 1 10*3/uL (ref 0.1–1.0)
MONOS PCT: 12 % (ref 3–12)
Neutro Abs: 4.4 10*3/uL (ref 1.7–7.7)
Neutrophils Relative %: 50 % (ref 43–77)
Platelets: 170 10*3/uL (ref 150–400)
RBC: 4.67 MIL/uL (ref 4.22–5.81)
RDW: 12.3 % (ref 11.5–15.5)
WBC: 8.7 10*3/uL (ref 4.0–10.5)

## 2014-02-17 LAB — ETHANOL: Alcohol, Ethyl (B): 221 mg/dL — ABNORMAL HIGH (ref 0–11)

## 2014-02-17 LAB — COMPREHENSIVE METABOLIC PANEL
ALT: 20 U/L (ref 0–53)
AST: 21 U/L (ref 0–37)
Albumin: 4.1 g/dL (ref 3.5–5.2)
Alkaline Phosphatase: 47 U/L (ref 39–117)
Anion gap: 17 — ABNORMAL HIGH (ref 5–15)
BILIRUBIN TOTAL: 0.3 mg/dL (ref 0.3–1.2)
BUN: 11 mg/dL (ref 6–23)
CALCIUM: 8.6 mg/dL (ref 8.4–10.5)
CO2: 23 mEq/L (ref 19–32)
Chloride: 95 mEq/L — ABNORMAL LOW (ref 96–112)
Creatinine, Ser: 0.78 mg/dL (ref 0.50–1.35)
GLUCOSE: 103 mg/dL — AB (ref 70–99)
Potassium: 3 mEq/L — ABNORMAL LOW (ref 3.7–5.3)
Sodium: 135 mEq/L — ABNORMAL LOW (ref 137–147)
Total Protein: 7 g/dL (ref 6.0–8.3)

## 2014-02-17 MED ORDER — HYDROGEN PEROXIDE 3 % EX SOLN
CUTANEOUS | Status: AC
Start: 2014-02-17 — End: 2014-02-17
  Filled 2014-02-17: qty 473

## 2014-02-17 NOTE — ED Notes (Signed)
Pt denies SI or HI. He is alert and oriented. He denies pain.

## 2014-02-17 NOTE — ED Notes (Signed)
Meal tray provided to the patient. Pt sitting up. He is alert and oriented.

## 2014-02-17 NOTE — ED Notes (Signed)
Patient is arousable to pain. Patient reports "drinking 8-10 beers", "2 celexa", "7 trazadone" over the course of the day. Patient denies pain at this time.

## 2014-02-17 NOTE — ED Notes (Signed)
Pt. On cardiac monitor. 

## 2014-02-17 NOTE — ED Provider Notes (Signed)
CSN: [id number]     Arrival date & time 02/17/14  7829 History   First MD Initiated Contact with Patient 02/17/14 [phone]     Chief Complaint  Patient presents with  . Alcohol Intoxication  . Ingestion     (Consider location/radiation/quality/duration/timing/severity/associated sxs/prior Treatment) HPI 23 year old now presents to the emergency department via EMS from his home with complaint of excessive sleepiness, alcohol intoxication, and possible overdose.  Patient is somnolent, but can be aroused to noxious stimuli.  He reports after getting off work tonight around 11 PM he took "a handful" of trazodone to help him sleep.  Patient reports he's been on trazodone for some time, and frequently takes 3-4 tablets to get to sleep at night.  Patient also has been drinking alcohol, he reports that he does this nightly as well.  He is unsure why his roommate tonight decided to call 911.  Patient told the nurse that he took 2 Celexa.  He denies any other coingestions.  Patient is reported to have taken 7 trazodone, but he reports only taking 3-4.  He denies this was a suicide attempt.  He reports he was just trying to get to sleep. Past Medical History  Diagnosis Date  . Mental disorder   . Depression    History reviewed. No pertinent past surgical history. No family history on file. History  Substance Use Topics  . Smoking status: Current Every Day Smoker -- 2.00 packs/day    Types: Cigarettes  . Smokeless tobacco: Not on file  . Alcohol Use: 0.0 oz/week    18-24 Cans of beer per week     Comment: daily use    Review of Systems  Unable to perform ROS: Other   alcohol intoxication    Allergies  Review of patient's allergies indicates no known allergies.  Home Medications   Prior to Admission medications   Medication Sig Start Date End Date Taking? Authorizing Provider  mirtazapine (REMERON) 15 MG tablet Take 1 tablet (15 mg total) by mouth at bedtime. For depression/sleep 05/25/13    Sanjuana Kava, NP  traZODone (DESYREL) 100 MG tablet Take 1 tablet (100 mg total) by mouth at bedtime as needed for sleep. 05/25/13   Sanjuana Kava, NP   BP 110/64  Pulse 56  Resp 12  SpO2 100% Physical Exam  Nursing note and vitals reviewed. Constitutional: He appears well-developed and well-nourished. No distress.  HENT:  Head: Normocephalic and atraumatic.  Nose: Nose normal.  Mouth/Throat: Oropharynx is clear and moist.  Eyes: Conjunctivae and EOM are normal. Pupils are equal, round, and reactive to light.  Neck: Normal range of motion. Neck supple. No JVD present. No tracheal deviation present. No thyromegaly present.  Cardiovascular: Normal rate, regular rhythm, normal heart sounds and intact distal pulses.  Exam reveals no gallop and no friction rub.   No murmur heard. Pulmonary/Chest: Effort normal and breath sounds normal. No stridor. No respiratory distress. He has no wheezes. He has no rales. He exhibits no tenderness.  Abdominal: Soft. Bowel sounds are normal. He exhibits no distension and no mass. There is no tenderness. There is no rebound and no guarding.  Musculoskeletal: Normal range of motion. He exhibits no edema and no tenderness.  Lymphadenopathy:    He has no cervical adenopathy.  Neurological: He displays normal reflexes. He exhibits normal muscle tone. Coordination normal.  Patient is somnolent but arousable with noxious stimuli quickly falls back to sleep.  He is protecting his airway.  Skin: Skin is  warm and dry. No rash noted. No erythema. No pallor.  Psychiatric: He has a normal mood and affect. His behavior is normal. Judgment and thought content normal.    ED Course  Procedures (including critical care time) Labs Review Labs Reviewed  COMPREHENSIVE METABOLIC PANEL - Abnormal; Notable for the following:    Sodium 135 (*)    Potassium 3.0 (*)    Chloride 95 (*)    Glucose, Bld 103 (*)    Anion gap 17 (*)    All other components within normal limits   ETHANOL - Abnormal; Notable for the following:    Alcohol, Ethyl (B) 221 (*)    All other components within normal limits  CBC WITH DIFFERENTIAL    Imaging Review No results found.   EKG Interpretation None      MDM   Final diagnoses:  Alcohol intoxication, uncomplicated  Medication overdose, undetermined intent, initial encounter    23 year old male with alcohol intoxication with congestion of trazodone and possible Celexa.  Will allow to sober and reassess once more awake  Olivia Mackie, MD 02/17/14 [phone]

## 2014-02-17 NOTE — ED Notes (Signed)
Patient presents from home via [facility] for ETOH intoxication. Patient report taking 7 trazadone over the course of the day, along with alcohol consumption. VS per EMS BP 110/76, 58-60 HR, CBG 117, Resp 16.

## 2014-02-17 NOTE — ED Notes (Signed)
Pt resting. Rise and fall of chest noted. Side rails up and call button with in reach. VSS. Will continue to monitor.

## 2014-02-17 NOTE — ED Notes (Signed)
Bed: NW29 Expected date:  Expected time:  Means of arrival:  Comments: Bed 13, EMS, ETOH/Trazadone

## 2014-02-17 NOTE — ED Notes (Signed)
Pt resting in bed.

## 2014-02-17 NOTE — ED Notes (Signed)
Pt ambulatory with steady gait upon discharge. Pt was given a bus pass for transportation. He is leaving with d/c papers, cell phone, ID, cigarettes,money, and converse shoes with grey hat.

## 2014-02-17 NOTE — Discharge Instructions (Signed)
Alcohol Intoxication °Alcohol intoxication occurs when the amount of alcohol that a person has consumed impairs his or her ability to mentally and physically function. Alcohol directly impairs the normal chemical activity of the brain. Drinking large amounts of alcohol can lead to changes in mental function and behavior, and it can cause many physical effects that can be harmful.  °Alcohol intoxication can range in severity from mild to very severe. Various factors can affect the level of intoxication that occurs, such as the person's age, gender, weight, frequency of alcohol consumption, and the presence of other medical conditions (such as diabetes, seizures, or heart conditions). Dangerous levels of alcohol intoxication may occur when people drink large amounts of alcohol in a short period (binge drinking). Alcohol can also be especially dangerous when combined with certain prescription medicines or "recreational" drugs. °SIGNS AND SYMPTOMS °Some common signs and symptoms of mild alcohol intoxication include: °· Loss of coordination. °· Changes in mood and behavior. °· Impaired judgment. °· Slurred speech. °As alcohol intoxication progresses to more severe levels, other signs and symptoms will appear. These may include: °· Vomiting. °· Confusion and impaired memory. °· Slowed breathing. °· Seizures. °· Loss of consciousness. °DIAGNOSIS  °Your health care provider will take a medical history and perform a physical exam. You will be asked about the amount and type of alcohol you have consumed. Blood tests will be done to measure the concentration of alcohol in your blood. In many places, your blood alcohol level must be lower than 80 mg/dL (0.08%) to legally drive. However, many dangerous effects of alcohol can occur at much lower levels.  °TREATMENT  °People with alcohol intoxication often do not require treatment. Most of the effects of alcohol intoxication are temporary, and they go away as the alcohol naturally  leaves the body. Your health care provider will monitor your condition until you are stable enough to go home. Fluids are sometimes given through an IV access tube to help prevent dehydration.  °HOME CARE INSTRUCTIONS °· Do not drive after drinking alcohol. °· Stay hydrated. Drink enough water and fluids to keep your urine clear or pale yellow. Avoid caffeine.   °· Only take over-the-counter or prescription medicines as directed by your health care provider.   °SEEK MEDICAL CARE IF:  °· You have persistent vomiting.   °· You do not feel better after a few days. °· You have frequent alcohol intoxication. Your health care provider can help determine if you should see a substance use treatment counselor. °SEEK IMMEDIATE MEDICAL CARE IF:  °· You become shaky or tremble when you try to stop drinking.   °· You shake uncontrollably (seizure).   °· You throw up (vomit) blood. This may be bright red or may look like black coffee grounds.   °· You have blood in your stool. This may be bright red or may appear as a black, tarry, bad smelling stool.   °· You become lightheaded or faint.   °MAKE SURE YOU:  °· Understand these instructions. °· Will watch your condition. °· Will get help right away if you are not doing well or get worse. °Document Released: 02/18/2005 Document Revised: 01/11/2013 Document Reviewed: 10/14/2012 °ExitCare® Patient Information ©2015 [employer]. This information is not intended to replace advice given to you by your health care provider. Make sure you discuss any questions you have with your health care provider. ° ° °Emergency Department Resource Guide °1) Find a Doctor and Pay Out of Pocket °Although you won't have to find out who is covered by your   insurance plan, it is a good idea to ask around and get recommendations. You will then need to call the office and see if the doctor you have chosen will accept you as a new patient and what types of options they offer for patients who are self-pay.  Some doctors offer discounts or will set up payment plans for their patients who do not have insurance, but you will need to ask so you aren't surprised when you get to your appointment. ° °2) Contact Your Local Health Department °Not all health departments have doctors that can see patients for sick visits, but many do, so it is worth a call to see if yours does. If you don't know where your local health department is, you can check in your phone book. The CDC also has a tool to help you locate your state's health department, and many state websites also have listings of all of their local health departments. ° °3) Find a Walk-in Clinic °If your illness is not likely to be very severe or complicated, you may want to try a walk in clinic. These are popping up all over the country in pharmacies, drugstores, and shopping centers. They're usually staffed by nurse practitioners or physician assistants that have been trained to treat common illnesses and complaints. They're usually fairly quick and inexpensive. However, if you have serious medical issues or chronic medical problems, these are probably not your best option. ° °No Primary Care Doctor: °- Call Health Connect at  832-8000 - they can help you locate a primary care doctor that  accepts your insurance, provides certain services, etc. °- Physician Referral Service- 1-800-533-3463 ° °Chronic Pain Problems: °Organization         Address  Phone   Notes  °[facility]  (336) 297-2271 Patients need to be referred by their primary care doctor.  ° °Medication Assistance: °Organization         Address  Phone   Notes  °[place] [facility] [address] °[place], [place] [address] (336) 641-8030 --Must be a resident of [place] °-- Must have NO insurance coverage whatsoever (no Medicaid/ Medicare, etc.) °-- The pt. MUST have a primary care doctor that directs their care regularly and follows them in the  community °  °[facility]  (866) 331-1348   °[employer]  (888) 892-1162   ° °Agencies that provide inexpensive medical care: °Organization         Address  Phone   Notes  °[facility]  (336) 832-8035   °[facility]    (336) 832-7272   °[facility] [address] °[place], [place] [address] (336) 832-4777   °[facility] [address], °[place] (336) 271-4999   °[facility]    (336) 373-0678   °[facility]    (336) 272-1050   °[facility] ° [address], [place] Phone:  (336) 832-4444, Fax:  (336) 832-4440 Hours of Operation:  9 am - 6 pm, M-F.  Also accepts Medicaid/Medicare and self-pay.  °[facility] ° [address], [place] Phone: (336) 832-3150, Fax: (336) [phone]. Hours of Operation:  8:30 am - 5:30 pm, M-F.  Also accepts Medicaid and self-pay.  °[facility] [place] [address], [place] Phone: (336) 878-6027   °[facility] [address], [place], [place] (336)723-1848, Ext. 123 Mondays &   Thursdays: 7-9 AM.  First 15 patients are seen on a first come, first serve basis. °  ° °Medicaid-accepting [place] Providers: ° °Organization         Address  Phone   Notes  °[facility] [address], [place] (336) 641-2100 Also accepts self-pay patients.  °[facility] [address], [place] ° (336) 856-9996   °[facility] [address], [place] (336) 288-8857   °[facility] [address] [place] [address], [place] (336) 299-7000   °Veita Bland [address], [place]  ° (336) 373-1557 Only accepts  Access Medicaid patients after they have their name applied to their card.  ° °Self-Pay (no insurance) in [place]: ° °Organization         Address  Phone   Notes  °[facility] [address], [place] (336) 832-1970   °[facility] [address], [place] (336) 832-4400   °[facility] ° [address], [place] (336) 992-4800   °[facility]/Dr. Osei-Bonsu ° [address] [place] [address], [place] or [address], [place] (336) 841-8500 Phone number for both [place] and [place] locations is the same.  °[facility] [address], [place] (336) 299-0000   °[facility] [address] [place] [address], [place] or [address] (336) 852-7530 °(336) 878-2260   °[facility] [address], [place] (336) 350-1642, phone; (336) 294-5005, fax Sees patients 1st and 3rd Saturday of every month.  Must not qualify for public or private insurance (i.e. Medicaid, Medicare, [place] Health Choice, Veterans' Benefits) • Household income should be no more than 200% of the poverty level •The clinic cannot treat you if you are pregnant or think you are pregnant • Sexually transmitted diseases are not treated at the clinic.  ° ° °Dental Care: °Organization         Address  Phone  Notes  °[facility] [address], [place] (336) 641-6152 Accepts children up to age 21 who are enrolled in Medicaid or [place] Health Choice; pregnant women with a Medicaid card; and children who have applied for Medicaid or [place] Health Choice, but were declined, whose parents can pay a reduced fee at time of service.  °[facility] [place]  [address], [place] (336) 641-7733 Accepts children up to age 21 who are enrolled in Medicaid or [place] Health Choice; pregnant women with a Medicaid card; and children who have applied for Medicaid or [place] Health Choice, but were declined, whose parents can pay a reduced fee at time of service.  °[facility] ° [address],  (336) 641-4533 Patients are seen by appointment only. Walk-ins are not accepted. [facility] will see patients 18 years of age and older. °Monday - Tuesday (8am-5pm) °Most Wednesdays (8:30-5pm) °$30 per visit, cash only  °[facility] ° [address], [place] (336) 641-4533 Patients are seen by appointment only. Walk-ins are not accepted. [facility] will see patients 18 years of age and older. °One Wednesday Evening (Monthly: Volunteer Based).  $30 per visit, cash only  °[facility]  (919) 537-3737 for adults; Children under   age 4, call Graduate Pediatric Dentistry at (919) [phone]. Children aged 4-14, please call (919) [phone] to request a pediatric application. ° Dental services are provided in all areas of dental care including fillings, crowns and bridges, complete and partial dentures, implants, gum treatment, root canals, and extractions. Preventive care is also provided. Treatment is provided to both adults and children. °Patients are selected via a lottery and there is often a waiting list. °  °[facility] [address], °[place] ° (336) 763-8833 [email or web address] °  °[facility] [address], [place], [place] (336)723-1848, Ext. 123 Second and Fourth Thursday of each month, opens at 6:30 AM; Clinic ends at 9 AM.  Patients are seen on a first-come first-served basis, and a limited number are seen during each clinic.  ° °[facility] ° [address], [place], [place] (336) 723-7904   Eligibility Requirements °You must have lived in [place], [place], or [place] counties for at least the last three months. °  You cannot be eligible for state or federal sponsored healthcare insurance, including Veterans Administration, Medicaid, or Medicare. °  You generally cannot be eligible for healthcare insurance through your employer.  °  How to apply: °Eligibility screenings are held every  Tuesday and Wednesday afternoon from 1:00 pm until 4:00 pm. You do not need an appointment for the interview!  °[facility] [address], [place], [place] 336-631-2330   °[facility]  336-342-8273   °[facility]  336-703-3100   °[facility]  336-570-6415   ° °Behavioral Health Resources in the Community: °Intensive Outpatient Programs °Organization         Address  Phone  Notes  °[facility] [address], [place], [place] 336-878-6098   °[facility] [address], [place], [place] 336-832-9800   °[facility] [address], [place], [place] ° 336-882-2125   °[facility] [address],  °[place], [place] 1-800-853-5163 or 336-641-4981   °Substance Abuse Resources °Organization         Address  Phone  Notes  °[facility]  336-882-2125   °[facility]  336-784-9470   °[facility]  336-285-9073   °[facility]  336-845-3988   °Residential & Outpatient Substance Abuse Program  1-800-659-3381   °Psychological Services °Organization         Address  Phone  Notes  °[facility]  336- 832-9600   °[facility]  336- 378-7881   °[facility] [address], [place] 1-800-853-5163 or 336-641-4981   ° °Mobile Crisis Teams °Organization         Address  Phone  Notes  °[facility], Mobile Crisis Care Unit  1-877-626-1772   °Assertive °[facility] ° [address] [place], [place] 336-834-9664   °Sharon DeEsch [address] °[place] 336-554-5454   ° °Self-Help/Support Groups °Organization         Address  Phone             Notes  °[facility]  - variety of support groups  336- 373-1402 Call for more information  °Narcotics Anonymous (NA), [facility] [address], °[place]  2 meetings at this location   ° °Residential Treatment Programs °Organization         Address  Phone  Notes  °[facility] [address],    °[place]  1-866-801-8205   °[facility] ° [address], [place], [place] 704-293-8524   °[facility] [address], [place] 336-845-3988 Admissions: 8am-3pm M-F  °[facility] [address]    °[place], [place] 336-841-1104   °[facility] [address], [place], [place] 336-379-7146   °[facility] [address]  °[place], [place] 336-285-9073   °[facility] Intensive Outpatient [address], [place], [place] 336-852-3033   °[facility] [address]  °[place], [place] 1-877-615-2722 or 336-784-9470   °[facility] ([facility] [address] [place], [place] 336-227-7417 Accepts Medicaid  °[facility] [address]  °[place] 1-800-659-3381 Substance Abuse/Addiction Treatment  ° °[place] Behavioral Health Resources °Organization         Address  Phone  Notes  °[facility]  (888) 581-9988   °Julie Brannon, PhD [address] [place], [place]   (336) 349-5553 or (336) 951-0000   °[facility]   [address] °[place], [place] (336) 349-4454   °[facility] [address], [place], [place] (336) 342-8316 Insurance/Medicaid/sponsorship through [facility]  °[facility] [address]                                    [place], [place] (336) 342-8316 Therapy/tele-psych/case  °[facility] [address]  ° [place], [place] (336) 349-2233    °Dr. Arfeen  (336) 349-4544   °[facility] [place]  [employer] [place] [facility] 1) [address], [place] °2) [address], [place] °3)  [address], [place] (336) 349-3220 °(336) 342-7768 ° °(336) 342-8140   °[place] Child Abuse [facility] (336) 342-1394 or (336) 342-3537 (After  Hours)    ° ° ° °

## 2014-10-03 ENCOUNTER — Emergency Department (HOSPITAL_COMMUNITY)
Admission: EM | Admit: 2014-10-03 | Discharge: 2014-10-03 | Disposition: A | Discharge status: Home / Self Care | Payer: Self-pay | Attending: Emergency Medicine | Admitting: Emergency Medicine

## 2014-10-03 ENCOUNTER — Encounter (HOSPITAL_COMMUNITY): Payer: Self-pay | Admitting: Emergency Medicine

## 2014-10-03 ENCOUNTER — Emergency Department (HOSPITAL_COMMUNITY): Payer: Self-pay

## 2014-10-03 DIAGNOSIS — S4992XA Unspecified injury of left shoulder and upper arm, initial encounter: Secondary | ICD-10-CM | POA: Insufficient documentation

## 2014-10-03 DIAGNOSIS — F329 Major depressive disorder, single episode, unspecified: Secondary | ICD-10-CM | POA: Insufficient documentation

## 2014-10-03 DIAGNOSIS — Y998 Other external cause status: Secondary | ICD-10-CM | POA: Insufficient documentation

## 2014-10-03 DIAGNOSIS — S300XXA Contusion of lower back and pelvis, initial encounter: Secondary | ICD-10-CM | POA: Insufficient documentation

## 2014-10-03 DIAGNOSIS — S30810A Abrasion of lower back and pelvis, initial encounter: Secondary | ICD-10-CM | POA: Insufficient documentation

## 2014-10-03 DIAGNOSIS — T148XXA Other injury of unspecified body region, initial encounter: Secondary | ICD-10-CM

## 2014-10-03 DIAGNOSIS — Y92410 Unspecified street and highway as the place of occurrence of the external cause: Secondary | ICD-10-CM | POA: Insufficient documentation

## 2014-10-03 DIAGNOSIS — Z720 Tobacco use: Secondary | ICD-10-CM | POA: Insufficient documentation

## 2014-10-03 DIAGNOSIS — Y9301 Activity, walking, marching and hiking: Secondary | ICD-10-CM | POA: Insufficient documentation

## 2014-10-03 DIAGNOSIS — S93402A Sprain of unspecified ligament of left ankle, initial encounter: Secondary | ICD-10-CM | POA: Insufficient documentation

## 2014-10-03 MED ORDER — OXYCODONE-ACETAMINOPHEN 5-325 MG PO TABS: 2.0000 | ORAL_TABLET | Freq: Once | ORAL | Status: DC

## 2014-10-03 MED ORDER — IBUPROFEN 800 MG PO TABS: 800.0000 mg | ORAL_TABLET | Freq: Three times a day (TID) | ORAL | Status: AC

## 2014-10-03 MED ORDER — TETANUS-DIPHTH-ACELL PERTUSSIS 5-2.5-18.5 LF-MCG/0.5 IM SUSP
0.5000 mL | Freq: Once | INTRAMUSCULAR | Status: AC
  Administered 2014-10-03: 0.5 mL via INTRAMUSCULAR
  Filled 2014-10-03: qty 0.5

## 2014-10-03 MED ORDER — OXYCODONE-ACETAMINOPHEN 5-325 MG PO TABS
2.0000 | ORAL_TABLET | Freq: Once | ORAL | Status: AC
Start: 2014-10-03 — End: 2014-10-03
  Administered 2014-10-03: 2 via ORAL
  Filled 2014-10-03: qty 2

## 2014-10-03 MED ORDER — TRAMADOL HCL 50 MG PO TABS: 50.0000 mg | ORAL_TABLET | Freq: Four times a day (QID) | ORAL | Status: DC | PRN
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
## 2014-10-03 NOTE — Discharge Instructions (Signed)
Abrasion An abrasion is a cut or scrape of the skin. Abrasions do not extend through all layers of the skin and most heal within 10 days. It is important to care for your abrasion properly to prevent infection. CAUSES  Most abrasions are caused by falling on, or gliding across, the ground or other surface. When your skin rubs on something, the outer and inner layer of skin rubs off, causing an abrasion. DIAGNOSIS  Your caregiver will be able to diagnose an abrasion during a physical exam.  TREATMENT  Your treatment depends on how large and deep the abrasion is. Generally, your abrasion will be cleaned with water and a mild soap to remove any dirt or debris. An antibiotic ointment may be put over the abrasion to prevent an infection. A bandage (dressing) may be wrapped around the abrasion to keep it from getting dirty.  You may need a tetanus shot if:  You cannot remember when you had your last tetanus shot.  You have never had a tetanus shot.  The injury broke your skin. If you get a tetanus shot, your arm may swell, get red, and feel warm to the touch. This is common and not a problem. If you need a tetanus shot and you choose not to have one, there is a rare chance of getting tetanus. Sickness from tetanus can be serious.  HOME CARE INSTRUCTIONS   If a dressing was applied, change it at least once a day or as directed by your caregiver. If the bandage sticks, soak it off with warm water.   Wash the area with water and a mild soap to remove all the ointment 2 times a day. Rinse off the soap and pat the area dry with a clean towel.   Reapply any ointment as directed by your caregiver. This will help prevent infection and keep the bandage from sticking. Use gauze over the wound and under the dressing to help keep the bandage from sticking.   Change your dressing right away if it becomes wet or dirty.   Only take over-the-counter or prescription medicines for pain, discomfort, or fever as  directed by your caregiver.   Follow up with your caregiver within 24-48 hours for a wound check, or as directed. If you were not given a wound-check appointment, look closely at your abrasion for redness, swelling, or pus. These are signs of infection. SEEK IMMEDIATE MEDICAL CARE IF:   You have increasing pain in the wound.   You have redness, swelling, or tenderness around the wound.   You have pus coming from the wound.   You have a fever or persistent symptoms for more than 2-3 days.  You have a fever and your symptoms suddenly get worse.  You have a bad smell coming from the wound or dressing.  MAKE SURE YOU:   Understand these instructions.  Will watch your condition.  Will get help right away if you are not doing well or get worse. Document Released: 02/18/2005 Document Revised: 04/27/2012 Document Reviewed: 04/14/2011 [facility] Patient Information 2015 [place]. This information is not intended to replace advice given to you by your health care provider. Make sure you discuss any questions you have with your health care provider.  Ankle Sprain An ankle sprain is an injury to the strong, fibrous tissues (ligaments) that hold the bones of your ankle joint together.  CAUSES An ankle sprain is usually caused by a fall or by twisting your ankle. Ankle sprains most commonly occur when  you step on the outer edge of your foot, and your ankle turns inward. People who participate in sports are more prone to these types of injuries.  SYMPTOMS   Pain in your ankle. The pain may be present at rest or only when you are trying to stand or walk.  Swelling.  Bruising. Bruising may develop immediately or within 1 to 2 days after your injury.  Difficulty standing or walking, particularly when turning corners or changing directions. DIAGNOSIS  Your caregiver will ask you details about your injury and perform a physical exam of your ankle to determine if you have an ankle  sprain. During the physical exam, your caregiver will press on and apply pressure to specific areas of your foot and ankle. Your caregiver will try to move your ankle in certain ways. An X-ray exam may be done to be sure a bone was not broken or a ligament did not separate from one of the bones in your ankle (avulsion fracture).  TREATMENT  Certain types of braces can help stabilize your ankle. Your caregiver can make a recommendation for this. Your caregiver may recommend the use of medicine for pain. If your sprain is severe, your caregiver may refer you to a surgeon who helps to restore function to parts of your skeletal system (orthopedist) or a physical therapist. HOME CARE INSTRUCTIONS   Apply ice to your injury for 1-2 days or as directed by your caregiver. Applying ice helps to reduce inflammation and pain.  Put ice in a plastic bag.  Place a towel between your skin and the bag.  Leave the ice on for 15-20 minutes at a time, every 2 hours while you are awake.  Only take over-the-counter or prescription medicines for pain, discomfort, or fever as directed by your caregiver.  Elevate your injured ankle above the level of your heart as much as possible for 2-3 days.  If your caregiver recommends crutches, use them as instructed. Gradually put weight on the affected ankle. Continue to use crutches or a cane until you can walk without feeling pain in your ankle.  If you have a plaster splint, wear the splint as directed by your caregiver. Do not rest it on anything harder than a pillow for the first 24 hours. Do not put weight on it. Do not get it wet. You may take it off to take a shower or bath.  You may have been given an elastic bandage to wear around your ankle to provide support. If the elastic bandage is too tight (you have numbness or tingling in your foot or your foot becomes cold and blue), adjust the bandage to make it comfortable.  If you have an air splint, you may blow more  air into it or let air out to make it more comfortable. You may take your splint off at night and before taking a shower or bath. Wiggle your toes in the splint several times per day to decrease swelling. SEEK MEDICAL CARE IF:   You have rapidly increasing bruising or swelling.  Your toes feel extremely cold or you lose feeling in your foot.  Your pain is not relieved with medicine. SEEK IMMEDIATE MEDICAL CARE IF:  Your toes are numb or blue.  You have severe pain that is increasing. MAKE SURE YOU:   Understand these instructions.  Will watch your condition.  Will get help right away if you are not doing well or get worse. Document Released: 05/11/2005 Document Revised: 02/03/2012 Document Reviewed:  05/23/2011 [employer] Patient Information 2015 [place], [place]. This information is not intended to replace advice given to you by your health care provider. Make sure you discuss any questions you have with your health care provider.  Contusion A contusion is a deep bruise. Contusions are the result of an injury that caused bleeding under the skin. The contusion may turn blue, purple, or yellow. Minor injuries will give you a painless contusion, but more severe contusions may stay painful and swollen for a few weeks.  CAUSES  A contusion is usually caused by a blow, trauma, or direct force to an area of the body. SYMPTOMS   Swelling and redness of the injured area.  Bruising of the injured area.  Tenderness and soreness of the injured area.  Pain. DIAGNOSIS  The diagnosis can be made by taking a history and physical exam. An X-ray, CT scan, or MRI may be needed to determine if there were any associated injuries, such as fractures. TREATMENT  Specific treatment will depend on what area of the body was injured. In general, the best treatment for a contusion is resting, icing, elevating, and applying cold compresses to the injured area. Over-the-counter medicines may also be recommended  for pain control. Ask your caregiver what the best treatment is for your contusion. HOME CARE INSTRUCTIONS   Put ice on the injured area.  Put ice in a plastic bag.  Place a towel between your skin and the bag.  Leave the ice on for 15-20 minutes, 3-4 times a day, or as directed by your health care provider.  Only take over-the-counter or prescription medicines for pain, discomfort, or fever as directed by your caregiver. Your caregiver may recommend avoiding anti-inflammatory medicines (aspirin, ibuprofen, and naproxen) for 48 hours because these medicines may increase bruising.  Rest the injured area.  If possible, elevate the injured area to reduce swelling. SEEK IMMEDIATE MEDICAL CARE IF:   You have increased bruising or swelling.  You have pain that is getting worse.  Your swelling or pain is not relieved with medicines. MAKE SURE YOU:   Understand these instructions.  Will watch your condition.  Will get help right away if you are not doing well or get worse. Document Released: 02/18/2005 Document Revised: 05/16/2013 Document Reviewed: 03/16/2011 [facility] Patient Information 2015 [place], [place]. This information is not intended to replace advice given to you by your health care provider. Make sure you discuss any questions you have with your health care provider.

## 2014-10-03 NOTE — ED Notes (Signed)
Patient transported to CT 

## 2015-06-01 ENCOUNTER — Emergency Department (HOSPITAL_COMMUNITY)
Admission: EM | Admit: 2015-06-01 | Discharge: 2015-06-01 | Disposition: A | Discharge status: Home / Self Care | Payer: Self-pay | Attending: Emergency Medicine | Admitting: Emergency Medicine

## 2015-06-01 DIAGNOSIS — S01112A Laceration without foreign body of left eyelid and periocular area, initial encounter: Secondary | ICD-10-CM | POA: Insufficient documentation

## 2015-06-01 DIAGNOSIS — Z23 Encounter for immunization: Secondary | ICD-10-CM | POA: Insufficient documentation

## 2015-06-01 DIAGNOSIS — Z791 Long term (current) use of non-steroidal anti-inflammatories (NSAID): Secondary | ICD-10-CM | POA: Insufficient documentation

## 2015-06-01 DIAGNOSIS — Z8659 Personal history of other mental and behavioral disorders: Secondary | ICD-10-CM | POA: Insufficient documentation

## 2015-06-01 DIAGNOSIS — Y998 Other external cause status: Secondary | ICD-10-CM | POA: Insufficient documentation

## 2015-06-01 DIAGNOSIS — F17210 Nicotine dependence, cigarettes, uncomplicated: Secondary | ICD-10-CM | POA: Insufficient documentation

## 2015-06-01 DIAGNOSIS — Y9289 Other specified places as the place of occurrence of the external cause: Secondary | ICD-10-CM | POA: Insufficient documentation

## 2015-06-01 DIAGNOSIS — Y9302 Activity, running: Secondary | ICD-10-CM | POA: Insufficient documentation

## 2015-06-01 DIAGNOSIS — W01198A Fall on same level from slipping, tripping and stumbling with subsequent striking against other object, initial encounter: Secondary | ICD-10-CM | POA: Insufficient documentation

## 2015-06-01 DIAGNOSIS — IMO0002 Reserved for concepts with insufficient information to code with codable children: Secondary | ICD-10-CM

## 2015-06-01 MED ORDER — TETANUS-DIPHTH-ACELL PERTUSSIS 5-2.5-18.5 LF-MCG/0.5 IM SUSP
0.5000 mL | Freq: Once | INTRAMUSCULAR | Status: AC
  Administered 2015-06-01: 0.5 mL via INTRAMUSCULAR
  Filled 2015-06-01: qty 0.5
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
## 2015-06-01 NOTE — Discharge Instructions (Signed)
Facial Laceration ° A facial laceration is a cut on the face. These injuries can be painful and cause bleeding. Lacerations usually heal quickly, but they need special care to reduce scarring. °DIAGNOSIS  °Your health care provider will take a medical history, ask for details about how the injury occurred, and examine the wound to determine how deep the cut is. °TREATMENT  °Some facial lacerations may not require closure. Others may not be able to be closed because of an increased risk of infection. The risk of infection and the chance for successful closure will depend on various factors, including the amount of time since the injury occurred. °The wound may be cleaned to help prevent infection. If closure is appropriate, pain medicines may be given if needed. Your health care provider will use stitches (sutures), wound glue (adhesive), or skin adhesive strips to repair the laceration. These tools bring the skin edges together to allow for faster healing and a better cosmetic outcome. If needed, you may also be given a tetanus shot. °HOME CARE INSTRUCTIONS °· Only take over-the-counter or prescription medicines as directed by your health care provider. °· Follow your health care provider's instructions for wound care. These instructions will vary depending on the technique used for closing the wound. °For Sutures: °· Keep the wound clean and dry.   °· If you were given a bandage (dressing), you should change it at least once a day. Also change the dressing if it becomes wet or dirty, or as directed by your health care provider.   °· Wash the wound with soap and water 2 times a day. Rinse the wound off with water to remove all soap. Pat the wound dry with a clean towel.   °· After cleaning, apply a thin layer of the antibiotic ointment recommended by your health care provider. This will help prevent infection and keep the dressing from sticking.   °· You may shower as usual after the first 24 hours. Do not soak the  wound in water until the sutures are removed.   °· Get your sutures removed as directed by your health care provider. With facial lacerations, sutures should usually be taken out after 4-5 days to avoid stitch marks.   °· Wait a few days after your sutures are removed before applying any makeup. °For Skin Adhesive Strips: °· Keep the wound clean and dry.   °· Do not get the skin adhesive strips wet. You may bathe carefully, using caution to keep the wound dry.   °· If the wound gets wet, pat it dry with a clean towel.   °· Skin adhesive strips will fall off on their own. You may trim the strips as the wound heals. Do not remove skin adhesive strips that are still stuck to the wound. They will fall off in time.   °For Wound Adhesive: °· You may briefly wet your wound in the shower or bath. Do not soak or scrub the wound. Do not swim. Avoid periods of heavy sweating until the skin adhesive has fallen off on its own. After showering or bathing, gently pat the wound dry with a clean towel.   °· Do not apply liquid medicine, cream medicine, ointment medicine, or makeup to your wound while the skin adhesive is in place. This may loosen the film before your wound is healed.   °· If a dressing is placed over the wound, be careful not to apply tape directly over the skin adhesive. This may cause the adhesive to be pulled off before the wound is healed.   °· Avoid   prolonged exposure to sunlight or tanning lamps while the skin adhesive is in place. °· The skin adhesive will usually remain in place for 5-10 days, then naturally fall off the skin. Do not pick at the adhesive film.   °After Healing: °Once the wound has healed, cover the wound with sunscreen during the day for 1 full year. This can help minimize scarring. Exposure to ultraviolet light in the first year will darken the scar. It can take 1-2 years for the scar to lose its redness and to heal completely.  °SEEK MEDICAL CARE IF: °· You have a fever. °SEEK IMMEDIATE  MEDICAL CARE IF: °· You have redness, pain, or swelling around the wound.   °· You see a yellowish-white fluid (pus) coming from the wound.   °  °This information is not intended to replace advice given to you by your health care provider. Make sure you discuss any questions you have with your health care provider. °  °Document Released: 06/18/2004 Document Revised: 06/01/2014 Document Reviewed: 12/22/2012 °Elsevier Interactive Patient Education ©2016 [employer] ° °
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
## 2016-04-13 ENCOUNTER — Emergency Department (HOSPITAL_COMMUNITY)
Admission: EM | Admit: 2016-04-13 | Discharge: 2016-04-13 | Disposition: A | Discharge status: Home / Self Care | Payer: Self-pay | Attending: Emergency Medicine | Admitting: Emergency Medicine

## 2016-04-13 ENCOUNTER — Emergency Department (HOSPITAL_COMMUNITY): Payer: Self-pay

## 2016-04-13 ENCOUNTER — Encounter (HOSPITAL_COMMUNITY): Payer: Self-pay | Admitting: Emergency Medicine

## 2016-04-13 DIAGNOSIS — W458XXA Other foreign body or object entering through skin, initial encounter: Secondary | ICD-10-CM | POA: Insufficient documentation

## 2016-04-13 DIAGNOSIS — S81812A Laceration without foreign body, left lower leg, initial encounter: Secondary | ICD-10-CM | POA: Insufficient documentation

## 2016-04-13 DIAGNOSIS — Y999 Unspecified external cause status: Secondary | ICD-10-CM | POA: Insufficient documentation

## 2016-04-13 DIAGNOSIS — Y9302 Activity, running: Secondary | ICD-10-CM | POA: Insufficient documentation

## 2016-04-13 DIAGNOSIS — F17210 Nicotine dependence, cigarettes, uncomplicated: Secondary | ICD-10-CM | POA: Insufficient documentation

## 2016-04-13 DIAGNOSIS — Y929 Unspecified place or not applicable: Secondary | ICD-10-CM | POA: Insufficient documentation

## 2016-04-13 MED ORDER — LIDOCAINE-EPINEPHRINE (PF) 2 %-1:200000 IJ SOLN
10.0000 mL | Freq: Once | INTRAMUSCULAR | Status: AC
  Administered 2016-04-13: 10 mL
  Filled 2016-04-13: qty 20

## 2016-04-13 NOTE — ED Triage Notes (Signed)
Pt states that he was running and his L shin hit a car door causing a small laceration. Bleeding controlled at this time. Unknown tetanus. Alert and oriented.

## 2016-04-13 NOTE — Discharge Instructions (Signed)
Keep wound clean with mild soap and water. Keep area covered with a topical antibiotic ointment and bandage, keep bandage dry, and do not submerge in water for 24 hours. Ice and elevate for additional pain relief and swelling. Alternate between Ibuprofen and Tylenol for additional pain relief. Follow up with your primary care doctor or the [facility] in approximately 2-3 days for wound recheck and in 7-10 days for suture removal. Monitor area for signs of infection to include, but not limited to: increasing pain, spreading redness, drainage/pus, worsening swelling, or fevers. Return to emergency department for emergent changing or worsening symptoms.
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
## 2016-12-04 ENCOUNTER — Encounter (HOSPITAL_COMMUNITY): Payer: Self-pay | Admitting: Emergency Medicine

## 2016-12-04 ENCOUNTER — Emergency Department (HOSPITAL_COMMUNITY)
Admission: EM | Admit: 2016-12-04 | Discharge: 2016-12-04 | Disposition: A | Discharge status: Home / Self Care | Payer: Self-pay | Attending: Emergency Medicine | Admitting: Emergency Medicine

## 2016-12-04 DIAGNOSIS — Y999 Unspecified external cause status: Secondary | ICD-10-CM | POA: Insufficient documentation

## 2016-12-04 DIAGNOSIS — Z79899 Other long term (current) drug therapy: Secondary | ICD-10-CM | POA: Insufficient documentation

## 2016-12-04 DIAGNOSIS — Y939 Activity, unspecified: Secondary | ICD-10-CM | POA: Insufficient documentation

## 2016-12-04 DIAGNOSIS — S01511A Laceration without foreign body of lip, initial encounter: Secondary | ICD-10-CM | POA: Insufficient documentation

## 2016-12-04 DIAGNOSIS — Y929 Unspecified place or not applicable: Secondary | ICD-10-CM | POA: Insufficient documentation

## 2016-12-04 DIAGNOSIS — F17210 Nicotine dependence, cigarettes, uncomplicated: Secondary | ICD-10-CM | POA: Insufficient documentation

## 2016-12-04 DIAGNOSIS — S025XXA Fracture of tooth (traumatic), initial encounter for closed fracture: Secondary | ICD-10-CM | POA: Insufficient documentation

## 2016-12-04 DIAGNOSIS — W010XXA Fall on same level from slipping, tripping and stumbling without subsequent striking against object, initial encounter: Secondary | ICD-10-CM | POA: Insufficient documentation

## 2016-12-04 MED ORDER — TETANUS-DIPHTH-ACELL PERTUSSIS 5-2.5-18.5 LF-MCG/0.5 IM SUSP: 0.5000 mL | Freq: Once | INTRAMUSCULAR | Status: DC

## 2016-12-04 MED ORDER — HYDROCODONE-ACETAMINOPHEN 5-325 MG PO TABS
1.0000 | ORAL_TABLET | Freq: Four times a day (QID) | ORAL | 0 refills | Status: AC | PRN
Start: 2016-12-04 — End: ?

## 2016-12-04 MED ORDER — CLINDAMYCIN HCL 150 MG PO CAPS: 300.0000 mg | ORAL_CAPSULE | Freq: Three times a day (TID) | ORAL | 0 refills | Status: AC

## 2016-12-04 MED ORDER — LIDOCAINE-EPINEPHRINE 2 %-1:200000 IJ SOLN
10.0000 mL | Freq: Once | INTRAMUSCULAR | Status: AC
Start: 2016-12-04 — End: 2016-12-04
  Administered 2016-12-04: 10 mL
  Filled 2016-12-04: qty 20

## 2016-12-04 NOTE — ED Notes (Signed)
Provider in room performing procedure on pt upper lip

## 2016-12-04 NOTE — Discharge Instructions (Signed)
You need to see a dentist ASAP.  The sutures in your lip will dissolve in about a week.  Take antibiotics as prescribed.

## 2016-12-04 NOTE — ED Provider Notes (Signed)
[facility]-EMERGENCY DEPT Provider Note   CSN: [id number] Arrival date & time: 12/04/16  0309     History   Chief Complaint Chief Complaint  Patient presents with  . Lip Laceration    HPI Kristopher Hess is a 26 y.o. male.  Patient presents to the emergency department with chief complaint of lip injury. He states that he tripped and fell. His lip and to use strict the ground. His tooth broke. He sustained a laceration to his upper lip. He is intoxicated. Last tdap unknown.  He did not pass out.   The history is provided by the patient. No language interpreter was used.    Past Medical History:  Diagnosis Date  . Depression   . Mental disorder     Patient Active Problem List   Diagnosis Date Noted  . Cocaine abuse 05/23/2013  . Alcohol dependence (HCC) 05/21/2013  . Cannabis dependence (HCC) 05/21/2013  . Alcohol abuse 05/20/2013    History reviewed. No pertinent surgical history.     Home Medications    Prior to Admission medications   Medication Sig Start Date End Date Taking? Authorizing Provider  clindamycin (CLEOCIN) 150 MG capsule Take 2 capsules (300 mg total) by mouth 3 (three) times daily. May dispense as 150mg  capsules 12/04/16   Roxy Horseman, PA-C  HYDROcodone-acetaminophen (NORCO/VICODIN) 5-325 MG tablet Take 1-2 tablets by mouth every 6 (six) hours as needed. 12/04/16   Roxy Horseman, PA-C  ibuprofen (ADVIL,MOTRIN) 800 MG tablet Take 1 tablet (800 mg total) by mouth 3 (three) times daily. 10/03/14   Gilda Crease, MD  Pseudoephedrine HCl (SUDAFED 24 HOUR PO) Take 2 tablets by mouth every 6 (six) hours as needed.    [provider]    Family History History reviewed. No pertinent family history.  Social History Social History  Substance Use Topics  . Smoking status: Current Every Day Smoker    Packs/day: 2.00    Types: Cigarettes  . Smokeless tobacco: Never Used  . Alcohol use 0.0 oz/week    18 - 24 Cans of beer per week   Comment: daily use     Allergies   Pollen extract   Review of Systems Review of Systems  All other systems reviewed and are negative.    Physical Exam Updated Vital Signs BP (!) 130/96   Pulse 86   Temp 98.1 F (36.7 C) (Oral)   Resp 18   Ht 6\' 3"  (1.905 m)   Wt 88.5 kg (195 lb)   SpO2 98%   BMI 24.37 kg/m   Physical Exam  Constitutional: He is oriented to person, place, and time. No distress.  HENT:  Head: Normocephalic and atraumatic.  0.5 cm stellate laceration through the upper lip without involvement of the Vermillion border  Right upper incisor is broken in half  Remaining teeth are without acute abnormality  Eyes: Pupils are equal, round, and reactive to light. Conjunctivae and EOM are normal.  Neck: No tracheal deviation present.  Cardiovascular: Normal rate.   Pulmonary/Chest: Effort normal. No respiratory distress.  Abdominal: Soft.  Musculoskeletal: Normal range of motion.  Neurological: He is alert and oriented to person, place, and time.  Skin: Skin is warm and dry. He is not diaphoretic.  Psychiatric: Judgment normal.  Nursing note and vitals reviewed.    ED Treatments / Results  Labs (all labs ordered are listed, but only abnormal results are displayed) Labs Reviewed - No data to display  EKG  EKG Interpretation None  Radiology No results found.  Procedures Procedures (including critical care time) LACERATION REPAIR Performed by: Roxy HorsemanBROWNING, Kjirsten Bloodgood Authorized by: Roxy HorsemanBROWNING, Reg Bircher Consent: Verbal consent obtained. Risks and benefits: risks, benefits and alternatives were discussed Consent given by: patient Patient identity confirmed: provided demographic data Prepped and Draped in normal sterile fashion Wound explored  Laceration Location: through and through lip laceration  Laceration Length: 0.5 cm  No Foreign Bodies seen or palpated  Anesthesia: local infiltration  Local anesthetic: lidocaine 1% with  epinephrine  Anesthetic total: 2 ml  Irrigation method: syringe Amount of cleaning: Copious   Skin closure: 5-0 vicryl rapide  Number of sutures: 3  Technique: interrupted  Patient tolerance: Patient tolerated the procedure well with no immediate complications.   Dental Procedure Broken right upper incisor Dental block performed using 1% lidocaine with epinephrine 1 mL was injected Temrex cement was prepared in the typical fashion and placed over the broken tooth Patient tolerated the procedure well  Medications Ordered in ED Medications  lidocaine-EPINEPHrine (XYLOCAINE W/EPI) 2 %-1:200000 (PF) injection 10 mL (10 mLs Infiltration Given 12/04/16 0412)     Initial Impression / Assessment and Plan / ED Course  I have reviewed the triage vital signs and the nursing notes.  Pertinent labs & imaging results that were available during my care of the patient were reviewed by me and considered in my medical decision making (see chart for details).     Patient with broken tooth and lip laceration. Patient will need to follow-up with and history ASAP. Temrex cement was used to cover the broken tooth. The laceration was closed on the external surface and left open internally. Patient will be placed on clindamycin.  Final Clinical Impressions(s) / ED Diagnoses   Final diagnoses:  Lip laceration, initial encounter  Closed fracture of tooth, initial encounter    New Prescriptions New Prescriptions   CLINDAMYCIN (CLEOCIN) 150 MG CAPSULE    Take 2 capsules (300 mg total) by mouth 3 (three) times daily. May dispense as 150mg  capsules   HYDROCODONE-ACETAMINOPHEN (NORCO/VICODIN) 5-325 MG TABLET    Take 1-2 tablets by mouth every 6 (six) hours as needed.     Roxy HorsemanBrowning, [address], PA-C 12/04/16 0447    Dione BoozeGlick, David, MD 12/04/16 [phone]

## 2016-12-04 NOTE — ED Triage Notes (Signed)
Patient states that he fell and his tooth went threw his lip. Patient has a whole in his top lip and it is bleeding.

## 2021-02-19 ENCOUNTER — Emergency Department (HOSPITAL_COMMUNITY)
Admission: EM | Admit: 2021-02-19 | Discharge: 2021-02-19 | Disposition: A | Discharge status: Home / Self Care | Payer: Self-pay | Attending: Emergency Medicine | Admitting: Emergency Medicine

## 2021-02-19 ENCOUNTER — Emergency Department (HOSPITAL_COMMUNITY): Payer: Self-pay

## 2021-02-19 ENCOUNTER — Other Ambulatory Visit: Payer: Self-pay

## 2021-02-19 ENCOUNTER — Encounter (HOSPITAL_COMMUNITY): Payer: Self-pay | Admitting: *Deleted

## 2021-02-19 DIAGNOSIS — F17210 Nicotine dependence, cigarettes, uncomplicated: Secondary | ICD-10-CM | POA: Insufficient documentation

## 2021-02-19 DIAGNOSIS — Y9366 Activity, soccer: Secondary | ICD-10-CM | POA: Insufficient documentation

## 2021-02-19 DIAGNOSIS — X501XXA Overexertion from prolonged static or awkward postures, initial encounter: Secondary | ICD-10-CM | POA: Insufficient documentation

## 2021-02-19 DIAGNOSIS — M25571 Pain in right ankle and joints of right foot: Secondary | ICD-10-CM | POA: Insufficient documentation

## 2021-02-19 NOTE — ED Notes (Signed)
Pt ambulatory in ED lobby. 

## 2021-02-19 NOTE — ED Notes (Signed)
Ortho tech consulted at this time to apply ASO lace-up ankle brace and provide crutches.

## 2021-02-19 NOTE — ED Provider Notes (Signed)
[facility]-EMERGENCY DEPT Provider Note   CSN: [id number] Arrival date & time: 02/19/21  1348     History Chief Complaint  Patient presents with   Ankle Pain    Kristopher Hess is a 30 y.o. male.  HPI  30 year old male with a history of depression, cocaine abuse, alcohol abuse, mental disorder, presents to the emergency department today for evaluation of pain to the right ankle.  Patient states that he was playing soccer a few days ago when he twisted his right ankle.  He has pain to the right ankle and foot.  He also has some associated swelling.  He states pain is constant and is worse with palpation and weightbearing.  Past Medical History:  Diagnosis Date   Depression    Mental disorder     Patient Active Problem List   Diagnosis Date Noted   Cocaine abuse (HCC) 05/23/2013   Alcohol dependence (HCC) 05/21/2013   Cannabis dependence (HCC) 05/21/2013   Alcohol abuse 05/20/2013    History reviewed. No pertinent surgical history.     No family history on file.  Social History   Tobacco Use   Smoking status: Every Day    Packs/day: 2.00    Types: Cigarettes   Smokeless tobacco: Never  Vaping Use   Vaping Use: Never used  Substance Use Topics   Alcohol use: Yes    Alcohol/week: 18.0 - 24.0 standard drinks    Types: 18 - 24 Cans of beer per week    Comment: daily use   Drug use: Yes    Types: Cocaine, Marijuana    Home Medications Prior to Admission medications   Medication Sig Start Date End Date Taking? Authorizing Provider  clindamycin (CLEOCIN) 150 MG capsule Take 2 capsules (300 mg total) by mouth 3 (three) times daily. May dispense as 150mg  capsules 12/04/16   12/06/16, PA-C  HYDROcodone-acetaminophen (NORCO/VICODIN) 5-325 MG tablet Take 1-2 tablets by mouth every 6 (six) hours as needed. 12/04/16   12/06/16, PA-C  ibuprofen (ADVIL,MOTRIN) 800 MG tablet Take 1 tablet (800 mg total) by mouth 3 (three) times daily.  10/03/14   12/03/14, MD  Pseudoephedrine HCl (SUDAFED 24 HOUR PO) Take 2 tablets by mouth every 6 (six) hours as needed.    [provider]    Allergies    Pollen extract  Review of Systems   Review of Systems  Constitutional:  Negative for fever.  Musculoskeletal:        Right ankle/foot pain  Skin:  Negative for wound.   Physical Exam Updated Vital Signs BP (!) 161/91 (BP Location: Left Arm)   Pulse 78   Temp 98 F (36.7 C) (Oral)   Resp 18   Ht 6\' 3"  (1.905 m)   Wt 99.8 kg   SpO2 96%   BMI 27.50 kg/m   Physical Exam Constitutional:      General: He is not in acute distress.    Appearance: He is well-developed.  Eyes:     Conjunctiva/sclera: Conjunctivae normal.  Cardiovascular:     Rate and Rhythm: Normal rate.  Pulmonary:     Effort: Pulmonary effort is normal.  Skin:    General: Skin is warm and dry.  Neurological:     Mental Status: He is alert and oriented to person, place, and time.    ED Results / Procedures / Treatments   Labs (all labs ordered are listed, but only abnormal results are displayed) Labs Reviewed - No  data to display  EKG None  Radiology DG Ankle Complete Right  Result Date: 02/19/2021 CLINICAL DATA:  Rolled ankle. EXAM: RIGHT ANKLE - COMPLETE 3+ VIEW COMPARISON:  None. FINDINGS: There is no evidence of fracture, dislocation, or joint effusion. Vague cortical irregularity of the lateral talus. There is no evidence of arthropathy or other focal bone abnormality. Degenerative changes of the medial ankle. IMPRESSION: Vague cortical irregularity of the lateral talus. Underlying avulsion fracture not fully excluded. Recommend dedicated radiograph of the right foot for further evaluation. Electronically Signed   By: Tish Frederickson M.D.   On: 02/19/2021 15:32   DG Foot Complete Right  Result Date: 02/19/2021 CLINICAL DATA:  Pain over lateral aspect of foot, bruising EXAM: RIGHT FOOT COMPLETE - 3+ VIEW COMPARISON:  None.  FINDINGS: There is no acute fracture or dislocation identified. No definite talar fracture is identified. Foot alignment is normal. The Lisfranc and Chopart joints are intact. The joint spaces are preserved. The soft tissues are unremarkable. IMPRESSION: No definite talar fracture identified. Electronically Signed   By: Lesia Hausen M.D.   On: 02/19/2021 16:55    Procedures Procedures   Medications Ordered in ED Medications - No data to display  ED Course  I have reviewed the triage vital signs and the nursing notes.  Pertinent labs & imaging results that were available during my care of the patient were reviewed by me and considered in my medical decision making (see chart for details).    MDM Rules/Calculators/A&P                          Patient presenting with ankle pain after twisting ankle prior to arrival.  Vital signs stable and patient nontoxic-appearing.  X-ray of right foot and ankle negative for acute fracture abnormality.  A splint was applied and crutches given.  OrthO follow-up given and patient advised to follow-up with either PCP or orthopedics in 1 week for reevaluation.  Advised Tylenol, ibuprofen, and rice protocol for pain.  Advised to return to the ER for any new or worsening symptoms in the meantime.  All questions were answered and patient understands plan and reasons to return.    Final Clinical Impression(s) / ED Diagnoses Final diagnoses:  Acute right ankle pain    Rx / DC Orders ED Discharge Orders     None        Rayne Du 02/19/21 1732    Gwyneth Sprout, MD 02/23/21 [phone]

## 2021-02-19 NOTE — ED Triage Notes (Signed)
Pt complains of right ankle pain and swelling after rolling ankle while playing soccer 3 days ago.

## 2021-02-19 NOTE — ED Notes (Signed)
Patient transported to X-ray 

## 2021-02-19 NOTE — Progress Notes (Signed)
Orthopedic Tech Progress Note Patient Details:  Kristopher Hess 10/10/1990 [id number] Patient did not want crutches  Ortho Devices Type of Ortho Device: ASO Ortho Device/Splint Location: Right ankle Ortho Device/Splint Interventions: Application   Post Interventions Patient Tolerated: Well  Kristopher Hess 02/19/2021, 5:39 PM

## 2021-02-19 NOTE — Discharge Instructions (Addendum)
You may alternate taking Tylenol and Naproxen as needed for pain control. You may take Naproxen twice daily as directed on your discharge paperwork and you may take  [phone] mg of Tylenol every 6 hours. Do not exceed 4000 mg of Tylenol daily as this can lead to liver damage. Also, make sure to take Naproxen with meals as it can cause an upset stomach. Do not take other NSAIDs while taking Naproxen such as (Aleve, Ibuprofen, Aspirin, Celebrex, etc) and do not take more than the prescribed dose as this can lead to ulcers and bleeding in your GI tract. You may use warm and cold compresses to help with your symptoms.   You will need to call the orthopedic office to schedule an appointment for follow up  Please return to the emergency department for any new or worsening symptoms.

## 2021-02-19 NOTE — Progress Notes (Signed)
CSW met with pt regarding lack of PCP.  CSW provided contact information for Primary Care at [facility] and [place] and talked with pt about setting up an appt to establish care.  Pt verbalized understanding and accepted the information.  Lurline Idol, MSW, LCSW 9/28/20225:45 PM

## 2023-01-04 IMAGING — CR DG ANKLE COMPLETE 3+V*R*
3 series · 3 of 3 positions shown · non-contrast
Comparison: None.

CLINICAL DATA: Rolled ankle.

EXAM:
RIGHT ANKLE - COMPLETE 3+ VIEW

[x ankle ap right]
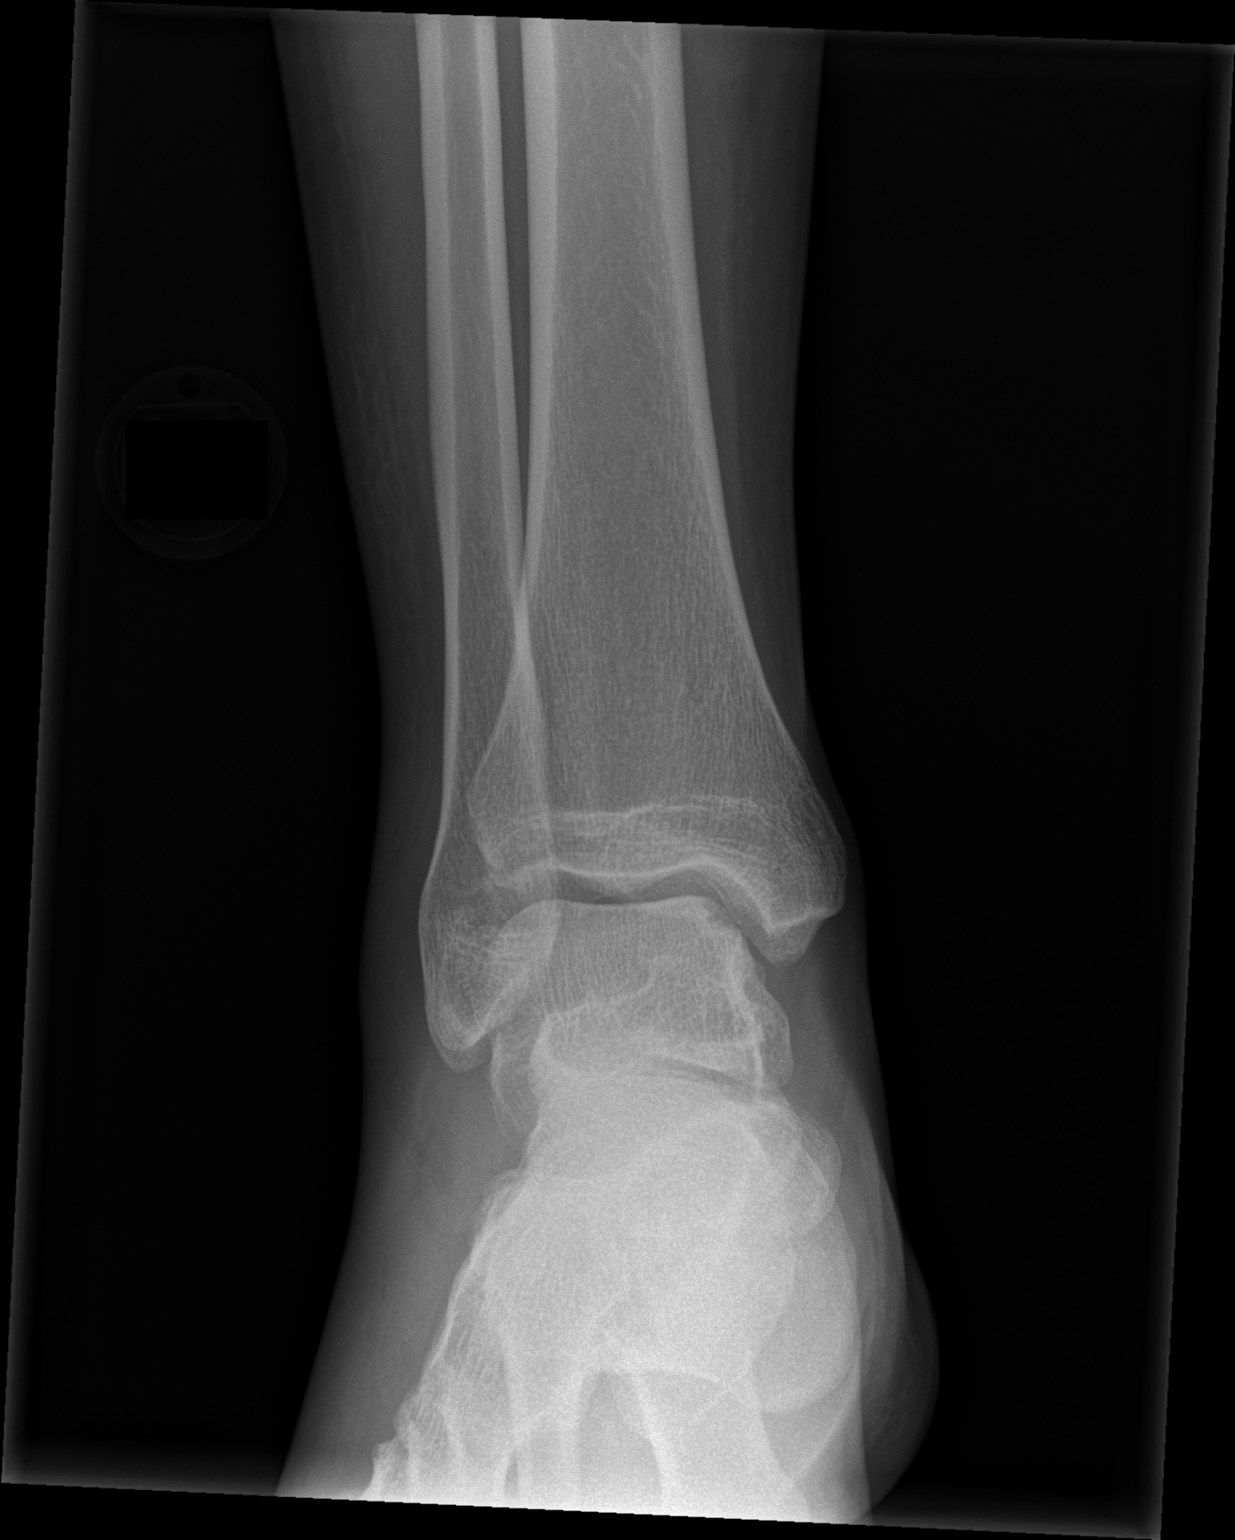

[x ankle obl right]
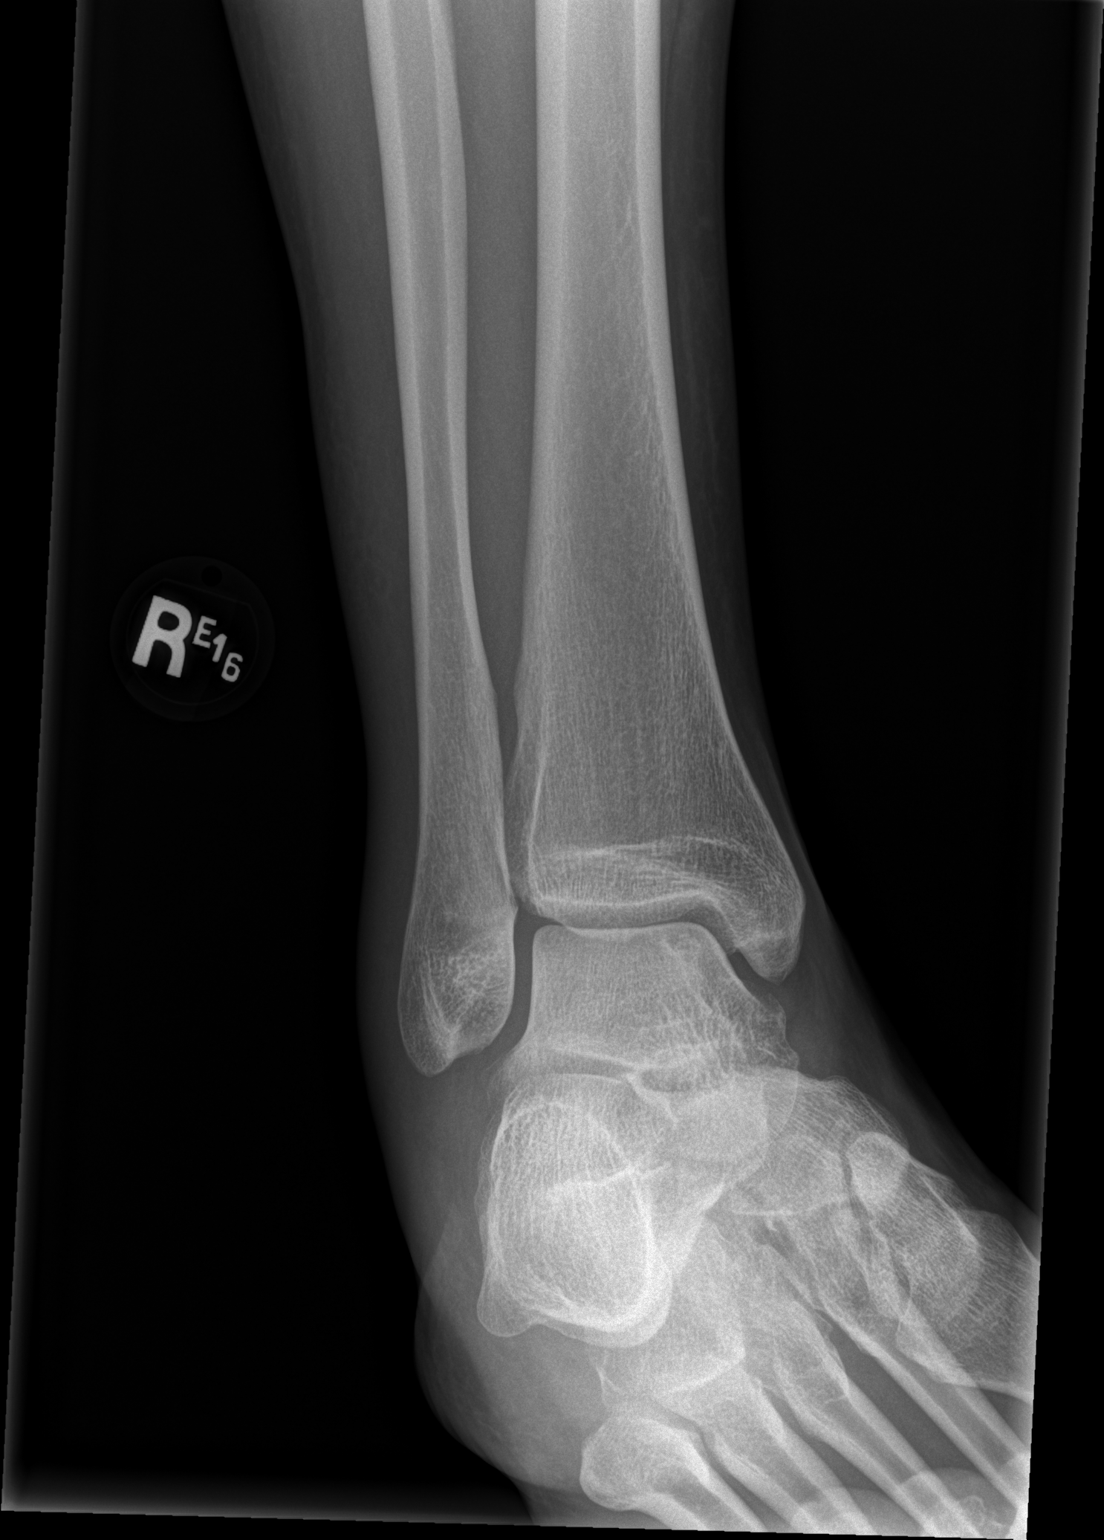

[x ankle lat right]
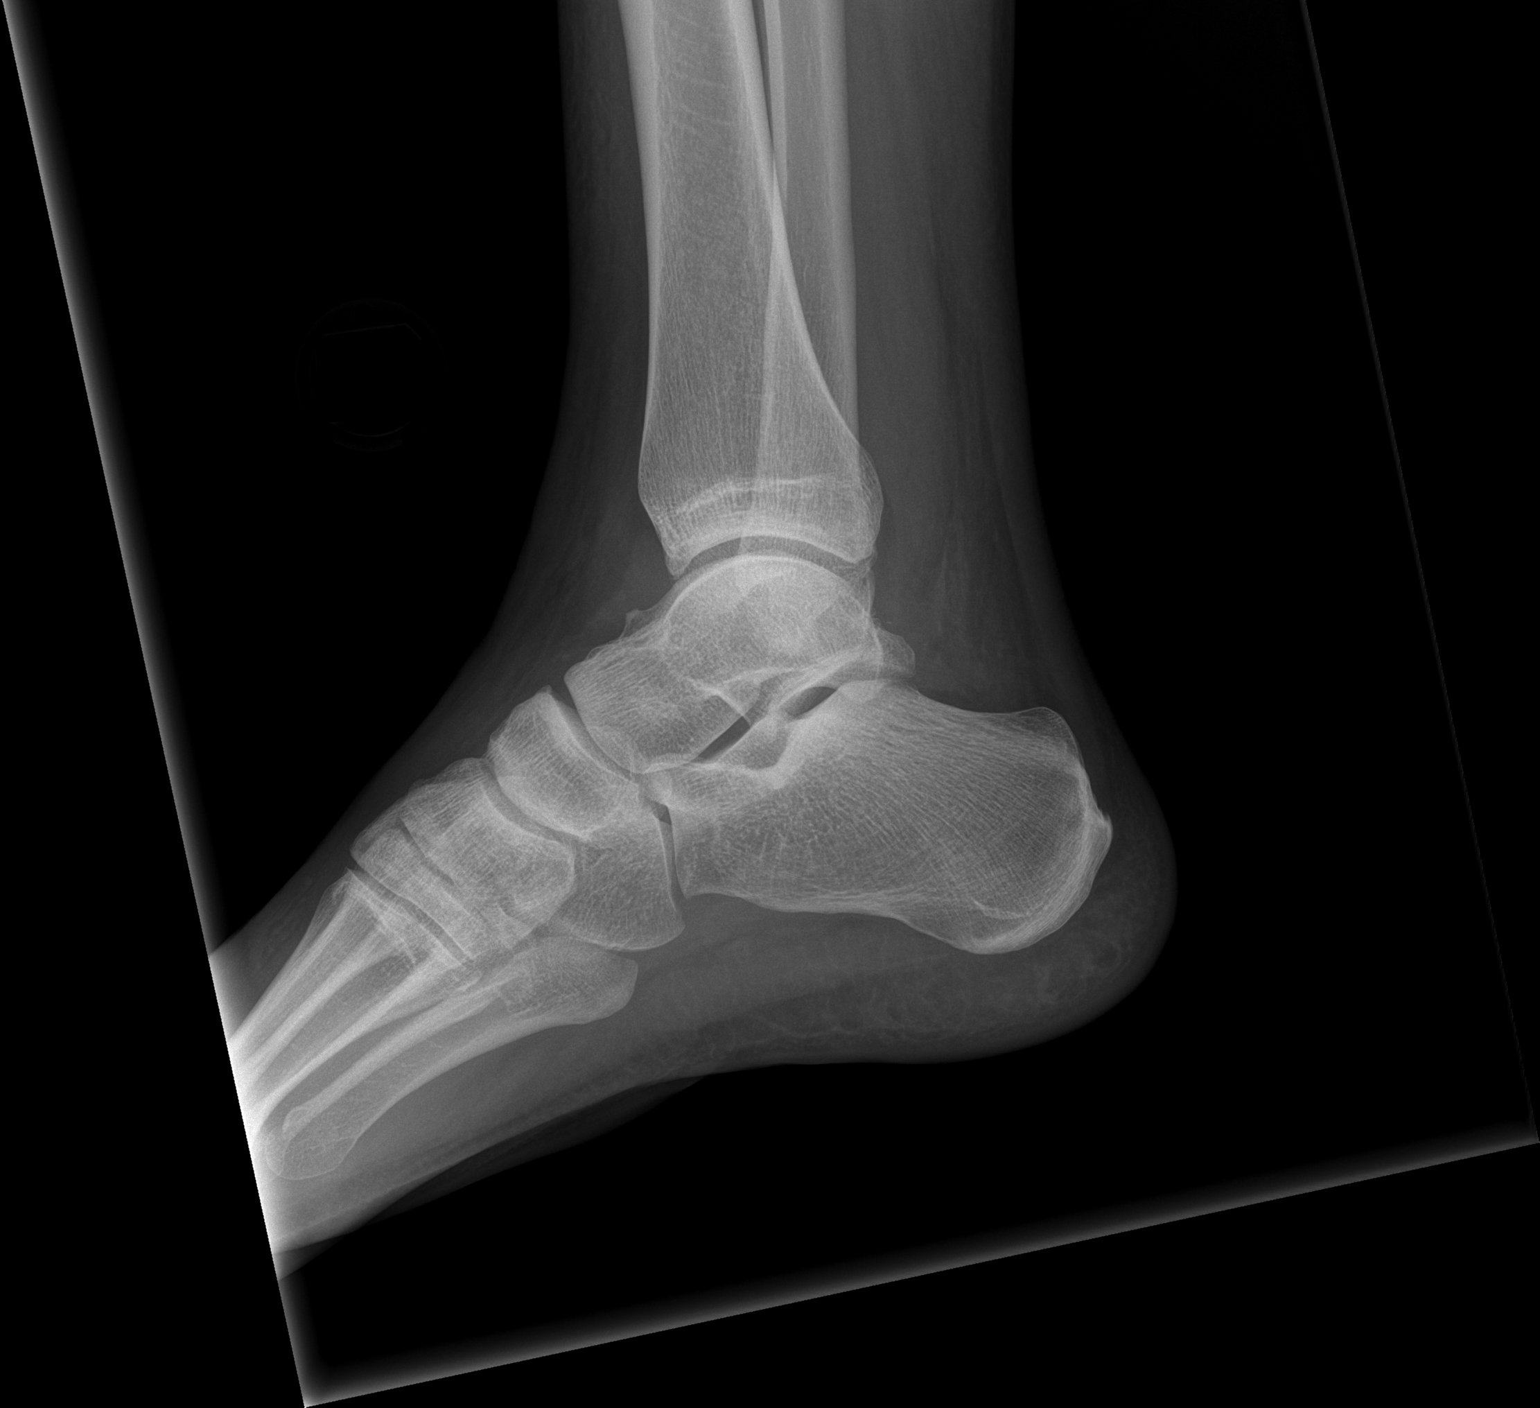

[3 of 3 positions shown; findings below may reference images not displayed]

FINDINGS: There is no evidence of fracture, dislocation, or joint effusion.
Vague cortical irregularity of the lateral talus. There is no
evidence of arthropathy or other focal bone abnormality.
Degenerative changes of the medial ankle.
IMPRESSION: Vague cortical irregularity of the lateral talus. Underlying
avulsion fracture not fully excluded. Recommend dedicated radiograph
of the right foot for further evaluation.
